# Patient Record
Sex: Female | Born: 1965 | Race: White | Hispanic: No | Marital: Married | Smoking: Never smoker
Health system: Southern US, Community
[De-identification: ages and names within clinical notes are randomized; demographics above are authoritative.]

## PROBLEM LIST (undated history)

## (undated) DIAGNOSIS — C8190 Hodgkin lymphoma, unspecified, unspecified site: Secondary | ICD-10-CM

## (undated) DIAGNOSIS — Q2572 Congenital pulmonary arteriovenous malformation: Secondary | ICD-10-CM

## (undated) HISTORY — DX: Hodgkin lymphoma, unspecified, unspecified site: C81.90

## (undated) HISTORY — DX: Congenital pulmonary arteriovenous malformation: Q25.72

---

## 2001-12-17 HISTORY — PX: PORTACATH PLACEMENT: SHX2246

## 2001-12-17 HISTORY — PX: LYMPH NODE BIOPSY: SHX201

## 2003-03-12 ENCOUNTER — Other Ambulatory Visit: Admission: RE | Admit: 2003-03-12 | Discharge: 2003-03-12 | Payer: Self-pay | Admitting: Obstetrics and Gynecology

## 2003-04-15 ENCOUNTER — Ambulatory Visit (HOSPITAL_COMMUNITY): Admission: RE | Admit: 2003-04-15 | Discharge: 2003-04-15 | Payer: Self-pay | Admitting: General Surgery

## 2003-12-18 DIAGNOSIS — C8190 Hodgkin lymphoma, unspecified, unspecified site: Secondary | ICD-10-CM

## 2003-12-18 HISTORY — DX: Hodgkin lymphoma, unspecified, unspecified site: C81.90

## 2004-04-10 ENCOUNTER — Encounter: Admission: RE | Admit: 2004-04-10 | Discharge: 2004-04-10 | Payer: Self-pay | Admitting: Family Medicine

## 2004-05-16 ENCOUNTER — Other Ambulatory Visit: Admission: RE | Admit: 2004-05-16 | Discharge: 2004-05-16 | Payer: Self-pay | Admitting: Obstetrics and Gynecology

## 2005-01-02 ENCOUNTER — Ambulatory Visit (HOSPITAL_COMMUNITY): Admission: RE | Admit: 2005-01-02 | Discharge: 2005-01-02 | Payer: Self-pay | Admitting: Obstetrics and Gynecology

## 2005-01-02 ENCOUNTER — Encounter (INDEPENDENT_AMBULATORY_CARE_PROVIDER_SITE_OTHER): Payer: Self-pay | Admitting: Specialist

## 2005-02-21 ENCOUNTER — Emergency Department (HOSPITAL_COMMUNITY): Admission: EM | Admit: 2005-02-21 | Discharge: 2005-02-21 | Payer: Self-pay | Admitting: Emergency Medicine

## 2005-05-28 ENCOUNTER — Encounter: Admission: RE | Admit: 2005-05-28 | Discharge: 2005-05-28 | Payer: Self-pay | Admitting: *Deleted

## 2005-06-27 ENCOUNTER — Ambulatory Visit: Payer: Self-pay | Admitting: Oncology

## 2005-09-26 ENCOUNTER — Ambulatory Visit: Payer: Self-pay | Admitting: Oncology

## 2005-12-25 IMAGING — US US INTRAOPERATIVE - NO REPORT
1 series · 4 of 4 positions shown · non-contrast
Comparison: none

[Series 1: us intraoperative - no report · 0.29mm/px · 4 of 4 slices shown]
[im 1/4]
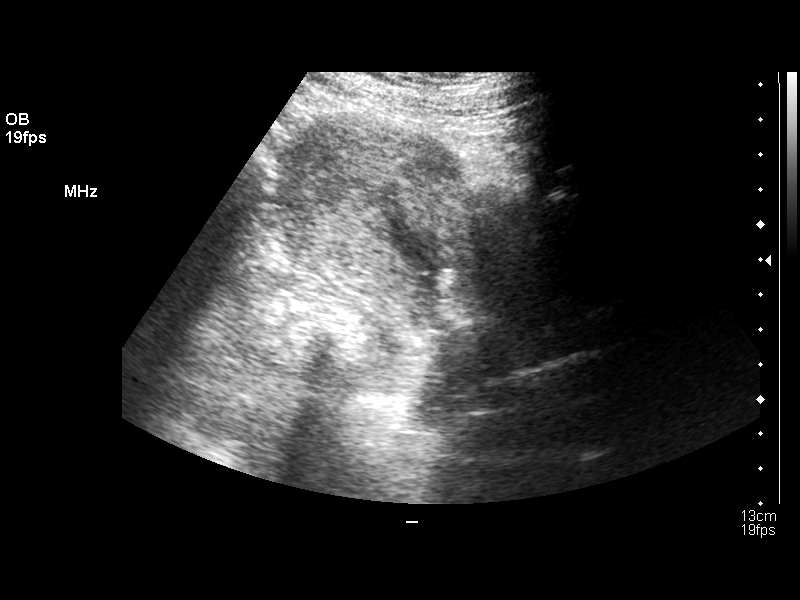
[im 2/4]
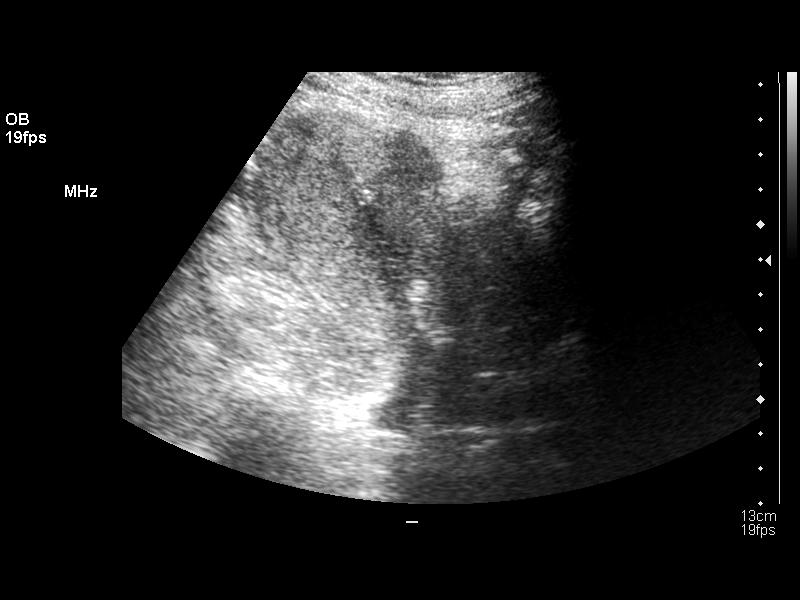
[im 3/4]
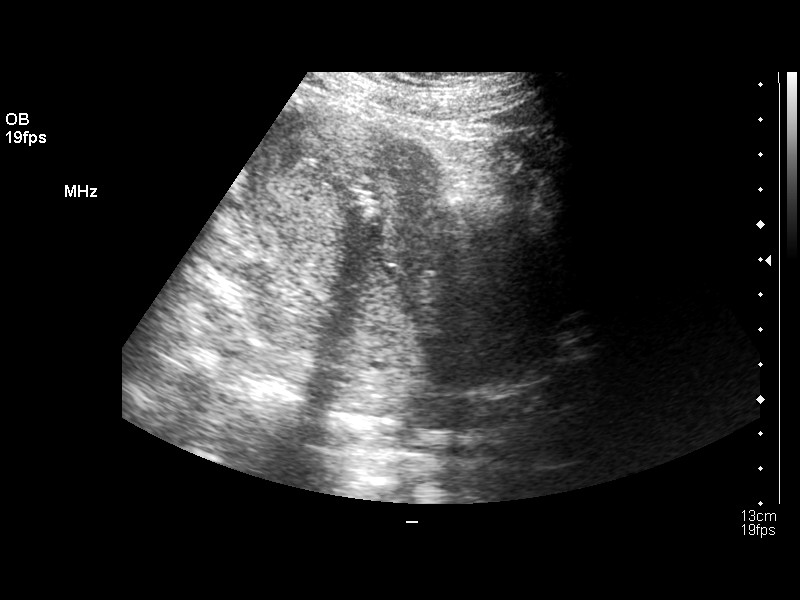
[im 4/4]
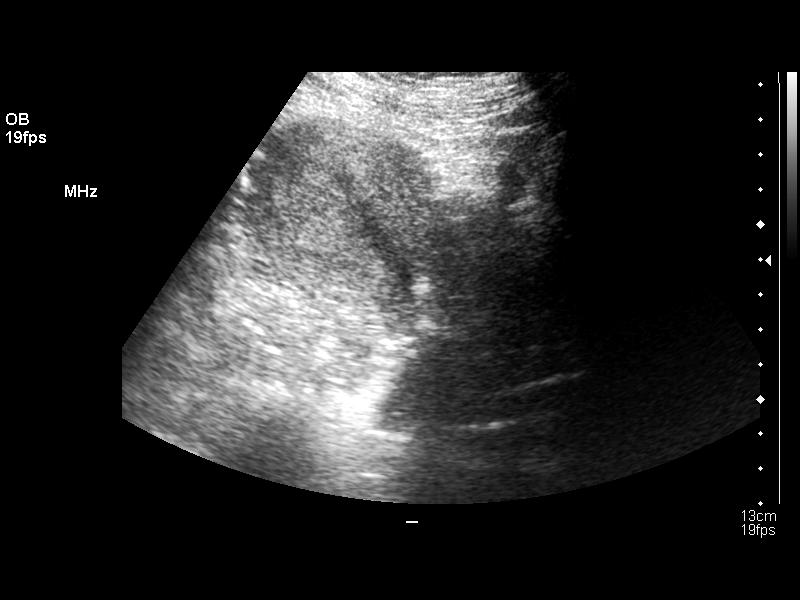

[4 of 4 positions shown; findings below may reference images not displayed]

ULTRASOUND INTRAOPERATIVE:

 Ultrasound was utilized in the operating room by the requesting physician.

## 2006-02-26 ENCOUNTER — Inpatient Hospital Stay (HOSPITAL_COMMUNITY): Admission: AD | Admit: 2006-02-26 | Discharge: 2006-02-28 | Payer: Self-pay | Admitting: Obstetrics and Gynecology

## 2006-03-11 ENCOUNTER — Encounter: Admission: RE | Admit: 2006-03-11 | Discharge: 2006-03-11 | Payer: Self-pay | Admitting: Family Medicine

## 2006-03-20 ENCOUNTER — Ambulatory Visit: Payer: Self-pay | Admitting: Oncology

## 2006-03-21 LAB — COMPREHENSIVE METABOLIC PANEL
ALT: 13 U/L (ref 0–40)
BUN: 10 mg/dL (ref 6–23)
CO2: 27 mEq/L (ref 19–32)
Calcium: 10 mg/dL (ref 8.4–10.5)
Chloride: 105 mEq/L (ref 96–112)
Creatinine, Ser: 1 mg/dL (ref 0.4–1.2)
Glucose, Bld: 74 mg/dL (ref 70–99)

## 2006-03-21 LAB — CBC WITH DIFFERENTIAL/PLATELET
BASO%: 0.3 % (ref 0.0–2.0)
EOS%: 2.6 % (ref 0.0–7.0)
MCH: 29.5 pg (ref 26.0–34.0)
MCV: 87.9 fL (ref 81.0–101.0)
MONO#: 0.5 10*3/uL (ref 0.1–0.9)
MONO%: 6.7 % (ref 0.0–13.0)
Platelets: 365 10*3/uL (ref 145–400)
RBC: 4.8 10*6/uL (ref 3.70–5.32)
WBC: 7 10*3/uL (ref 3.9–10.0)

## 2006-03-21 LAB — TSH: TSH: 1.504 u[IU]/mL (ref 0.350–5.500)

## 2006-03-25 ENCOUNTER — Ambulatory Visit (HOSPITAL_COMMUNITY): Admission: RE | Admit: 2006-03-25 | Discharge: 2006-03-25 | Payer: Self-pay | Admitting: Oncology

## 2006-03-27 ENCOUNTER — Encounter: Admission: RE | Admit: 2006-03-27 | Discharge: 2006-03-27 | Payer: Self-pay | Admitting: Obstetrics and Gynecology

## 2006-09-17 ENCOUNTER — Ambulatory Visit: Payer: Self-pay | Admitting: Oncology

## 2006-11-04 ENCOUNTER — Ambulatory Visit (HOSPITAL_COMMUNITY): Admission: RE | Admit: 2006-11-04 | Discharge: 2006-11-04 | Payer: Self-pay | Admitting: Oncology

## 2007-03-20 ENCOUNTER — Ambulatory Visit: Payer: Self-pay | Admitting: Oncology

## 2007-03-25 LAB — CBC WITH DIFFERENTIAL/PLATELET
Basophils Absolute: 0 10*3/uL (ref 0.0–0.1)
EOS%: 3 % (ref 0.0–7.0)
HGB: 15.4 g/dL (ref 11.6–15.9)
MCH: 31.2 pg (ref 26.0–34.0)
NEUT#: 3.8 10*3/uL (ref 1.5–6.5)
RDW: 12.5 % (ref 11.3–14.5)
WBC: 6.4 10*3/uL (ref 3.9–10.0)
lymph#: 1.9 10*3/uL (ref 0.9–3.3)

## 2007-03-25 LAB — COMPREHENSIVE METABOLIC PANEL
AST: 17 U/L (ref 0–37)
Albumin: 4.5 g/dL (ref 3.5–5.2)
Alkaline Phosphatase: 63 U/L (ref 39–117)
Glucose, Bld: 86 mg/dL (ref 70–99)
Potassium: 3.9 mEq/L (ref 3.5–5.3)
Sodium: 139 mEq/L (ref 135–145)
Total Protein: 7.7 g/dL (ref 6.0–8.3)

## 2007-06-18 ENCOUNTER — Ambulatory Visit: Payer: Self-pay | Admitting: Oncology

## 2007-12-24 ENCOUNTER — Ambulatory Visit: Payer: Self-pay | Admitting: Pulmonary Disease

## 2007-12-31 DIAGNOSIS — Q279 Congenital malformation of peripheral vascular system, unspecified: Secondary | ICD-10-CM | POA: Insufficient documentation

## 2008-01-08 ENCOUNTER — Ambulatory Visit: Payer: Self-pay | Admitting: Oncology

## 2008-01-12 ENCOUNTER — Encounter: Admission: RE | Admit: 2008-01-12 | Discharge: 2008-01-12 | Payer: Self-pay | Admitting: Obstetrics and Gynecology

## 2008-01-12 LAB — CBC WITH DIFFERENTIAL/PLATELET
EOS%: 3.2 % (ref 0.0–7.0)
MCH: 31 pg (ref 26.0–34.0)
MCV: 89.2 fL (ref 81.0–101.0)
MONO%: 7.5 % (ref 0.0–13.0)
RBC: 4.57 10*6/uL (ref 3.70–5.32)
RDW: 12.2 % (ref 11.3–14.5)

## 2008-01-12 LAB — ERYTHROCYTE SEDIMENTATION RATE: Sed Rate: 5 mm/hr (ref 0–30)

## 2008-07-11 ENCOUNTER — Ambulatory Visit: Payer: Self-pay | Admitting: Oncology

## 2008-07-19 LAB — CBC WITH DIFFERENTIAL/PLATELET
BASO%: 0.4 % (ref 0.0–2.0)
EOS%: 3.1 % (ref 0.0–7.0)
HCT: 43.2 % (ref 34.8–46.6)
HGB: 14.9 g/dL (ref 11.6–15.9)
MCH: 31.4 pg (ref 26.0–34.0)
NEUT%: 59.9 % (ref 39.6–76.8)
Platelets: 319 10*3/uL (ref 145–400)
RBC: 4.76 10*6/uL (ref 3.70–5.32)

## 2008-07-19 LAB — ERYTHROCYTE SEDIMENTATION RATE: Sed Rate: 2 mm/hr (ref 0–30)

## 2009-02-07 ENCOUNTER — Encounter: Admission: RE | Admit: 2009-02-07 | Discharge: 2009-02-07 | Payer: Self-pay | Admitting: Obstetrics and Gynecology

## 2009-02-10 ENCOUNTER — Encounter: Admission: RE | Admit: 2009-02-10 | Discharge: 2009-02-10 | Payer: Self-pay | Admitting: Obstetrics and Gynecology

## 2009-03-23 ENCOUNTER — Ambulatory Visit: Payer: Self-pay | Admitting: Oncology

## 2009-03-23 ENCOUNTER — Ambulatory Visit (HOSPITAL_COMMUNITY): Admission: RE | Admit: 2009-03-23 | Discharge: 2009-03-23 | Payer: Self-pay | Admitting: Oncology

## 2009-03-23 ENCOUNTER — Encounter: Payer: Self-pay | Admitting: Pulmonary Disease

## 2010-03-20 ENCOUNTER — Encounter: Admission: RE | Admit: 2010-03-20 | Discharge: 2010-03-20 | Payer: Self-pay | Admitting: Obstetrics and Gynecology

## 2010-03-22 ENCOUNTER — Ambulatory Visit: Payer: Self-pay | Admitting: Oncology

## 2011-01-06 ENCOUNTER — Encounter: Payer: Self-pay | Admitting: Oncology

## 2011-01-06 ENCOUNTER — Other Ambulatory Visit: Payer: Self-pay | Admitting: Obstetrics and Gynecology

## 2011-01-06 DIAGNOSIS — Z0000 Encounter for general adult medical examination without abnormal findings: Secondary | ICD-10-CM

## 2011-01-06 DIAGNOSIS — Z1231 Encounter for screening mammogram for malignant neoplasm of breast: Secondary | ICD-10-CM

## 2011-01-07 ENCOUNTER — Encounter: Payer: Self-pay | Admitting: Obstetrics and Gynecology

## 2011-03-20 ENCOUNTER — Ambulatory Visit (HOSPITAL_COMMUNITY)
Admission: RE | Admit: 2011-03-20 | Discharge: 2011-03-20 | Disposition: A | Discharge status: Home / Self Care | Payer: BC Managed Care – PPO | Source: Ambulatory Visit | Attending: Oncology | Admitting: Oncology

## 2011-03-20 ENCOUNTER — Other Ambulatory Visit: Payer: Self-pay | Admitting: Oncology

## 2011-03-20 ENCOUNTER — Encounter (HOSPITAL_BASED_OUTPATIENT_CLINIC_OR_DEPARTMENT_OTHER): Payer: BC Managed Care – PPO | Admitting: Oncology

## 2011-03-20 DIAGNOSIS — J984 Other disorders of lung: Secondary | ICD-10-CM

## 2011-03-20 DIAGNOSIS — C8112 Nodular sclerosis classical Hodgkin lymphoma, intrathoracic lymph nodes: Secondary | ICD-10-CM

## 2011-03-20 DIAGNOSIS — C8190 Hodgkin lymphoma, unspecified, unspecified site: Secondary | ICD-10-CM | POA: Insufficient documentation

## 2011-04-11 ENCOUNTER — Ambulatory Visit: Payer: Self-pay

## 2011-05-03 ENCOUNTER — Ambulatory Visit: Payer: Self-pay

## 2011-05-04 NOTE — Op Note (Signed)
NAME:  Shannon Mann, Shannon Mann                  ACCOUNT NO.:  [id number]   MEDICAL RECORD NO.:  [id number]          PATIENT TYPE:  INP   LOCATION:  9199                          FACILITY:  [facility]   PHYSICIAN:  Lenoard Aden, M.D.DATE OF BIRTH:  10/10/1966   DATE OF PROCEDURE:  02/26/2006  DATE OF DISCHARGE:                                 OPERATIVE REPORT   INDICATIONS FOR OPERATIVE VAGINAL DELIVERY:  Maternal exhaustion, prolonged  second stage.   OPERATION/PROCEDURE:  Outlet vacuum-assisted vaginal delivery.   DESCRIPTION OF PROCEDURE:  After being apprised of the risks of small risk  of cephalhematoma, scalp laceration, intracranial hemorrhage, fetal vertex  presenting straight occiput posterior position, +3 +4 station, Kiwi cup  explained and placed after emptying the bladder of approximately 50 mL of  clear urine.  Kiwi cup for three pulls.  No popoffs.  Full term living  female, second degree midline episiotomy.  Bulb suctioning performed.  Apgars 8 and 9.  Cord blood collected.  Placenta delivered manually intact.  Three-vessel cord noted.  No meconium noted.  No lacerations noted, vaginal  or cervical.  Cervix is without any laceration.  Estimated blood loss 500  mL.  Uterus with good tone status post procedure.  The patient tolerates the  procedure well and is recovering in good condition.      Lenoard Aden, M.D.  Electronically Signed     RJT/MEDQ  D:  02/26/2006  T:  02/27/2006  Job:  [id number]

## 2011-05-04 NOTE — H&P (Signed)
NAME:  Shannon Mann, Shannon Mann                  ACCOUNT NO.:  [id number]   MEDICAL RECORD NO.:  [id number]          PATIENT TYPE:  INP   LOCATION:  9162                          FACILITY:  [facility]   PHYSICIAN:  Lenoard Aden, M.D.DATE OF BIRTH:  1966/12/08   DATE OF ADMISSION:  02/26/2006  DATE OF DISCHARGE:                                HISTORY & PHYSICAL   CHIEF COMPLAINT:  Labor.   HISTORY OF PRESENT ILLNESS:  The patient is a 45 year old white female, G3,  P1, EGD of February 26, 2006 at [redacted] weeks gestation with increased frequency of  contractions today.   PAST MEDICAL HISTORY:  1.  Remarkable for forceps-assisted vaginal delivery in 2002, and D&E for a      trisomy 18 fetus in 2006.  2.  Asthma, currently asymptomatic.  3.  History of Hodgkin's disease, status post chemotherapy and radiation,      currently in remission.   SOCIAL HISTORY:  She is a nonsmoker, nondrinker.  Denies domestic or  physical violence.   FAMILY HISTORY:  Heart disease and hypercoagulability.   PRENATAL LABORATORY DATA:  Blood type A positive, rubella immune, hepatitis  and HIV nonreactive, GBS negative.   PHYSICAL EXAMINATION:  GENERAL:  She is a well-developed, well-nourished,  white female in no acute distress.  HEENT:  Normal.  LUNGS:  Clear.  HEART:  Regular rhythm.  ABDOMEN:  Soft, gravid, nontender.  Estimated fetal weight 7 to 7.5 pounds.  PELVIC:  Cervix is 6 cm, 90% vertex, 0 station.  EXTREMITIES:  No cords.  NEUROLOGIC:  Nonfocal.   IMPRESSION:  1.  Term intrauterine pregnancy in active labor.  2.  History of operative vaginal delivery with planned anticipated attempts      at vagina delivery.      Lenoard Aden, M.D.  Electronically Signed     RJT/MEDQ  D:  02/26/2006  T:  02/26/2006  Job:  [id number]

## 2011-08-16 ENCOUNTER — Other Ambulatory Visit: Payer: Self-pay | Admitting: Family Medicine

## 2011-08-16 ENCOUNTER — Ambulatory Visit
Admission: RE | Admit: 2011-08-16 | Discharge: 2011-08-16 | Disposition: A | Discharge status: Home / Self Care | Payer: BC Managed Care – PPO | Source: Ambulatory Visit | Attending: Family Medicine | Admitting: Family Medicine

## 2011-08-16 DIAGNOSIS — R059 Cough, unspecified: Secondary | ICD-10-CM

## 2011-08-16 DIAGNOSIS — R05 Cough: Secondary | ICD-10-CM

## 2012-02-05 ENCOUNTER — Telehealth: Payer: Self-pay | Admitting: *Deleted

## 2012-02-05 NOTE — Telephone Encounter (Signed)
Left VM reporting "I've been having symptoms for about 6 months now". Asking to be seen by Dr. Truett Perna. Left no details.

## 2012-02-05 NOTE — Telephone Encounter (Signed)
Left VM for patient to call back tomorrow with more details on her "symptoms" and details of any imaging or testing she has had done. Reminded her to please leave her DOB when she returns call.

## 2012-02-08 ENCOUNTER — Telehealth: Payer: Self-pay | Admitting: *Deleted

## 2012-02-08 NOTE — Telephone Encounter (Signed)
Message from pt reporting she has had a persistent cough "off and on for six months". Was seen by Dr. Gaylan Gerold in August with CXRay. Was told it was clear and given Albuterol inhaler for wheezing and cough. Concerned that cough keeps returning. Pt reports it feels as if there is a "lump" in her throat similar to when she was initially diagnosed. Asking to be seen by Dr. Truett Perna to "clear her mind". Reviewed with Dr. Truett Perna: Pt should see Dr. Shelle Iron again to work up this cough. Does not sound like this is related to lymphoma. Dr. Truett Perna is glad to discuss with Dr. Shelle Iron if necessary- will work pt in if Dr. Shelle Iron thinks it is related to lymphoma. Left message on voicemail for pt.

## 2012-02-19 ENCOUNTER — Ambulatory Visit
Admission: RE | Admit: 2012-02-19 | Discharge: 2012-02-19 | Disposition: A | Discharge status: Home / Self Care | Payer: BC Managed Care – PPO | Source: Ambulatory Visit | Attending: Obstetrics and Gynecology | Admitting: Obstetrics and Gynecology

## 2012-02-19 DIAGNOSIS — Z1231 Encounter for screening mammogram for malignant neoplasm of breast: Secondary | ICD-10-CM

## 2012-03-13 ENCOUNTER — Telehealth: Payer: Self-pay | Admitting: Oncology

## 2012-03-13 NOTE — Telephone Encounter (Signed)
pt rtn  call and confirm appt for 04/12

## 2012-03-13 NOTE — Telephone Encounter (Signed)
called pt lmovm for appt on 04/12. asked pt to rtn call to confrim appt

## 2012-03-25 ENCOUNTER — Encounter: Payer: Self-pay | Admitting: Pulmonary Disease

## 2012-03-26 ENCOUNTER — Encounter: Payer: Self-pay | Admitting: Pulmonary Disease

## 2012-03-26 ENCOUNTER — Ambulatory Visit (INDEPENDENT_AMBULATORY_CARE_PROVIDER_SITE_OTHER): Payer: BC Managed Care – PPO | Admitting: Pulmonary Disease

## 2012-03-26 VITALS — BP 102/78 | HR 102 | Temp 98.3°F | Ht 68.0 in | Wt 151.8 lb

## 2012-03-26 DIAGNOSIS — R05 Cough: Secondary | ICD-10-CM

## 2012-03-26 DIAGNOSIS — R053 Chronic cough: Secondary | ICD-10-CM

## 2012-03-26 DIAGNOSIS — R059 Cough, unspecified: Secondary | ICD-10-CM

## 2012-03-26 NOTE — Assessment & Plan Note (Signed)
The patient has a chronic cough that has been off and on since August of last year.  From her description it sounds more upper airway and lower, and she is having ongoing sinus pressure with discolored mucus at times and a nasally voice.  I wonder if she may have chronic sinusitis that has not totally cleared.  She was started on symbicort Insall some improvement, however she was also treated at the same time with antihistamines and other therapy.  We'll need to consider whether the patient needs to be on symbicort, and whether she may have an issue with some type of reactive airways disease.  Because her history is so suggestive of an upper airway cough, I would like to image her sinuses to see if she has chronic sinusitis.  If these are clear, we can consider more aggressive treatment for allergic rhinitis.  I've also discussed with her the behavioral therapies which can help with cyclical coughing.  If she does not improve, we'll need to do pulmonary function studies to evaluate for airways disease.  I have asked her to stop symbicort and albuterol in the interim, since these can also aggravate an upper airway cough.

## 2012-03-26 NOTE — Progress Notes (Signed)
Subjective:    Patient ID: Unknown Shannon Mann, female    DOB: February 10, 1966, 47 y.o.   MRN: [id number]  HPI The patient is a 46 year old female who I've been asked to see for chronic cough.  I had seen her in the past for a right lower lobe pulmonary AVM, and she has been asymptomatic from this standpoint.  She does have a history of Hodgkin's lymphoma, and was treated with radiation and bleomycin.  The chemotherapy had to be discontinued because of concern for pulmonary toxicity.  The patient also has a history of frequent sinusitis.  She was in her usual state of health until August of this year when she began to develop a severe cough.  There was no bilateral prodrome or other initiating event, but she later developed a sinus infection.  She was treated with antibiotics and nasal hygiene, and her cough improved.  It has been intermittent since that time approximately one month ago it worsened again.  Patient was treated with albuterol without improvement, but then symbicort, antihistamines, and other treatments were added with improvement noted.  She is unsure exactly what made her better.  The patient continues to have sinus pressure as well as a nasal voice.  She has intermittent mucus production from her nose that can be discolored at times.  She does have postnasal drip with fairly frequent throat clearing.  She notes a tickle in her throat as well as voice changes.  Her cough can increase with prolonged conversation.  She denies any issues with ongoing reflux.  She really has not had significant exertional dyspnea that would interfere with her activities of daily living, but she does not feel that she is breathing as well when she pushes herself.  Her last chest x-ray was in the fall of last year and was unremarkable for acute changes.  Her last PFTs were many years ago at the time of her chemotherapy.   Review of Systems  Constitutional: Negative for fever and unexpected weight change.  HENT: Positive for  congestion. Negative for ear pain, nosebleeds, sore throat, rhinorrhea, sneezing, trouble swallowing, dental problem, postnasal drip and sinus pressure.   Eyes: Negative for redness and itching.  Respiratory: Positive for cough and shortness of breath. Negative for chest tightness and wheezing.   Cardiovascular: Negative for palpitations and leg swelling.  Gastrointestinal: Negative for nausea and vomiting.  Genitourinary: Negative for dysuria.  Musculoskeletal: Negative for joint swelling.  Skin: Negative for rash.  Neurological: Negative for headaches.  Hematological: Does not bruise/bleed easily.  Psychiatric/Behavioral: Negative for dysphoric mood. The patient is not nervous/anxious.        Objective:   Physical Exam Constitutional:  Well developed, no acute distress  HENT:  Nares patent without discharge, +septal deviation to left with narrowing.  Oropharynx without exudate, palate and uvula are normal  Eyes:  Perrla, eomi, no scleral icterus  Neck:  No JVD, no TMG  Cardiovascular:  Normal rate, regular rhythm, no rubs or gallops.  No murmurs        Intact distal pulses  Pulmonary :  Normal breath sounds, no stridor or respiratory distress   No rales, rhonchi, or wheezing  Abdominal:  Soft, nondistended, bowel sounds present.  No tenderness noted.   Musculoskeletal:  No lower extremity edema noted.  Lymph Nodes:  No cervical lymphadenopathy noted  Skin:  No cyanosis noted  Neurologic:  Alert, appropriate, moves all 4 extremities without obvious deficit.         Assessment &  Plan:

## 2012-03-26 NOTE — Patient Instructions (Signed)
Stop symbicort and albuterol.  Please call me if cough worsens Will schedule for scan of your sinuses to make sure you do not have chronic sinusitis driving all of this.  Will call you with results Try to avoid throat clearing, do not overuse your voice. Will make further decisions about treatment after your sinus scan.

## 2012-03-28 ENCOUNTER — Ambulatory Visit: Payer: BC Managed Care – PPO | Admitting: Oncology

## 2012-03-31 ENCOUNTER — Ambulatory Visit (INDEPENDENT_AMBULATORY_CARE_PROVIDER_SITE_OTHER)
Admission: RE | Admit: 2012-03-31 | Discharge: 2012-03-31 | Disposition: A | Discharge status: Home / Self Care | Payer: BC Managed Care – PPO | Source: Ambulatory Visit | Attending: Pulmonary Disease | Admitting: Pulmonary Disease

## 2012-03-31 ENCOUNTER — Other Ambulatory Visit: Payer: Self-pay | Admitting: Pulmonary Disease

## 2012-03-31 DIAGNOSIS — R05 Cough: Secondary | ICD-10-CM

## 2012-03-31 DIAGNOSIS — R053 Chronic cough: Secondary | ICD-10-CM

## 2012-03-31 DIAGNOSIS — R059 Cough, unspecified: Secondary | ICD-10-CM

## 2012-03-31 NOTE — Progress Notes (Signed)
Quick Note:  Spoke with pt and notified of results per Dr Shelle Iron.. Pt verbalized understanding and denied any questions. She repeated the instructions back to me.  Order was sent to [facility] to schedule PFT at [facility] ______

## 2012-04-07 ENCOUNTER — Other Ambulatory Visit (HOSPITAL_COMMUNITY): Payer: Self-pay | Admitting: Respiratory Therapy

## 2012-04-07 ENCOUNTER — Ambulatory Visit (HOSPITAL_COMMUNITY)
Admission: RE | Admit: 2012-04-07 | Discharge: 2012-04-07 | Disposition: A | Discharge status: Home / Self Care | Payer: BC Managed Care – PPO | Source: Ambulatory Visit | Attending: Pulmonary Disease | Admitting: Pulmonary Disease

## 2012-04-07 DIAGNOSIS — R05 Cough: Secondary | ICD-10-CM | POA: Insufficient documentation

## 2012-04-07 DIAGNOSIS — R059 Cough, unspecified: Secondary | ICD-10-CM | POA: Insufficient documentation

## 2012-04-07 DIAGNOSIS — R0609 Other forms of dyspnea: Secondary | ICD-10-CM | POA: Insufficient documentation

## 2012-04-07 DIAGNOSIS — J988 Other specified respiratory disorders: Secondary | ICD-10-CM | POA: Insufficient documentation

## 2012-04-07 DIAGNOSIS — R0989 Other specified symptoms and signs involving the circulatory and respiratory systems: Secondary | ICD-10-CM | POA: Insufficient documentation

## 2012-04-07 MED ORDER — ALBUTEROL SULFATE (5 MG/ML) 0.5% IN NEBU
2.5000 mg | INHALATION_SOLUTION | Freq: Once | RESPIRATORY_TRACT | Status: AC
  Administered 2012-04-07: 2.5 mg via RESPIRATORY_TRACT

## 2012-08-07 IMAGING — CR DG CHEST 2V
2 series · 2 of 2 positions shown · non-contrast
Comparison: Chest x-ray 03/20/2011.

CLINICAL DATA: Cough.  History of Hodgkin's lymphoma.

CHEST - 2 VIEW

[view not recorded (1 of 2)]
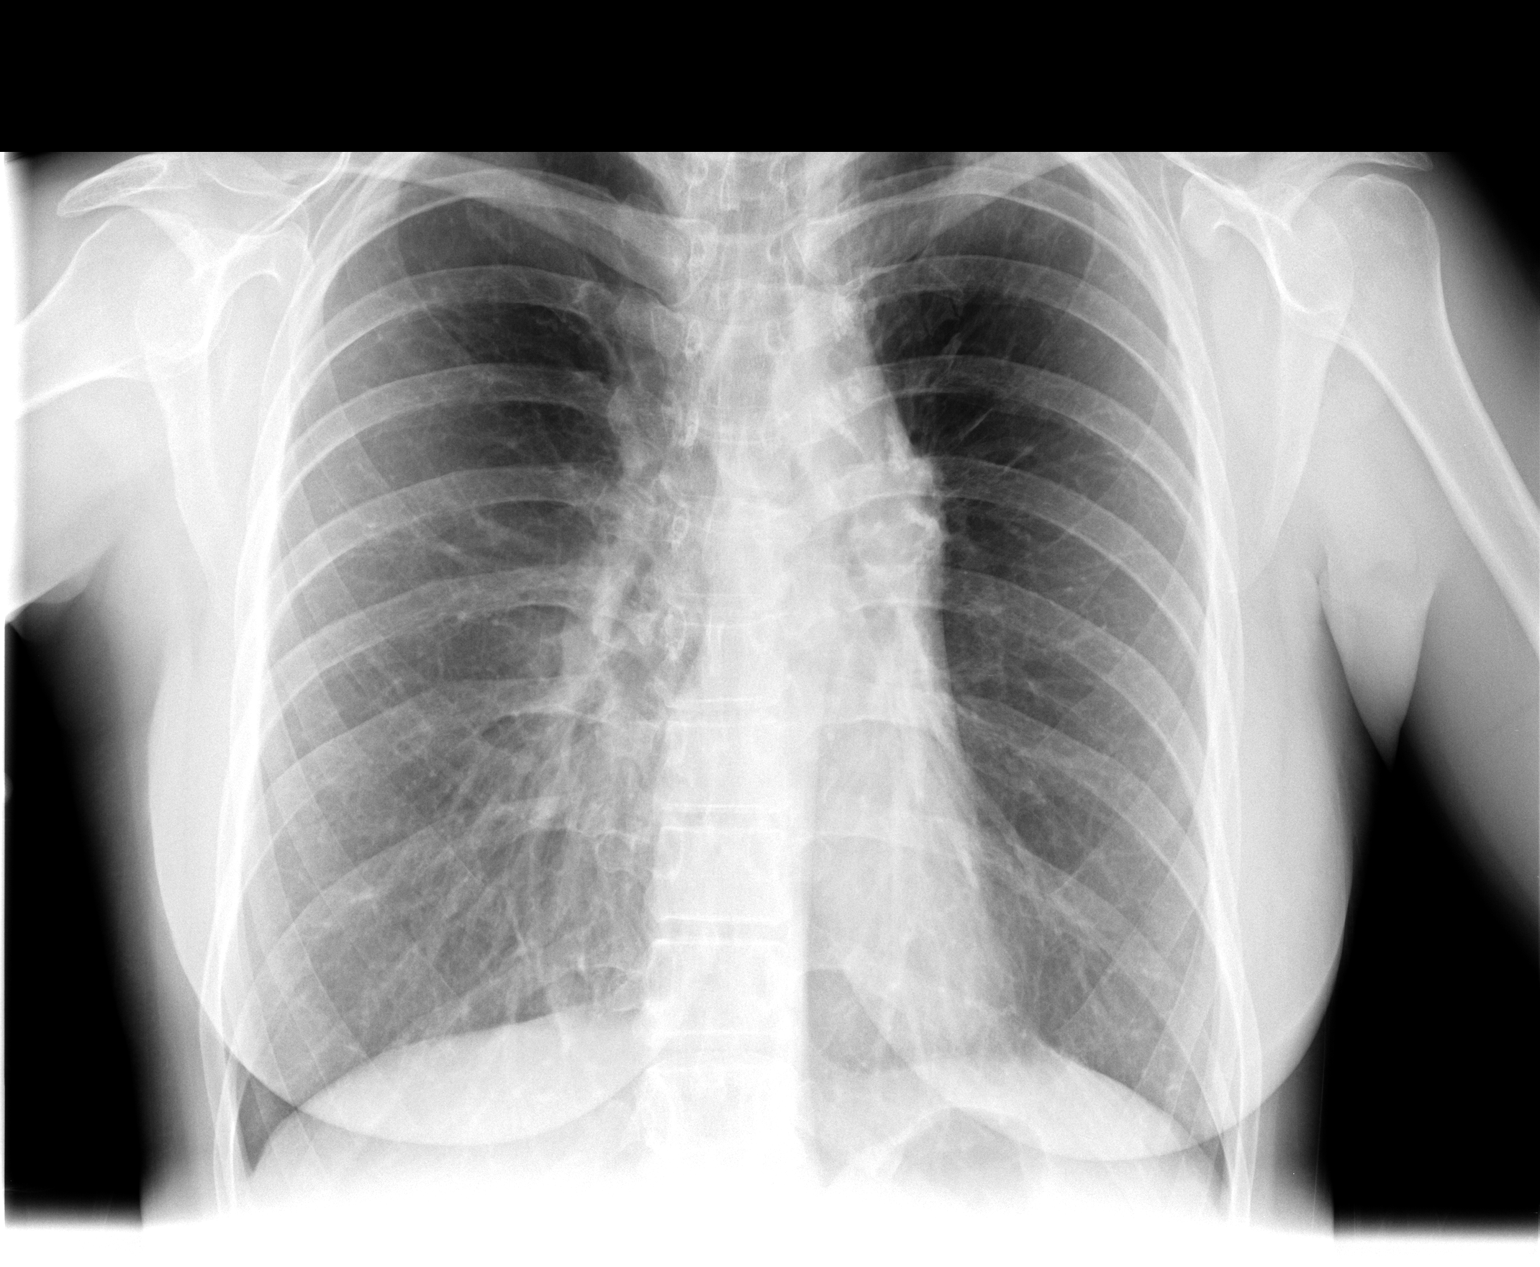

[view not recorded (2 of 2)]
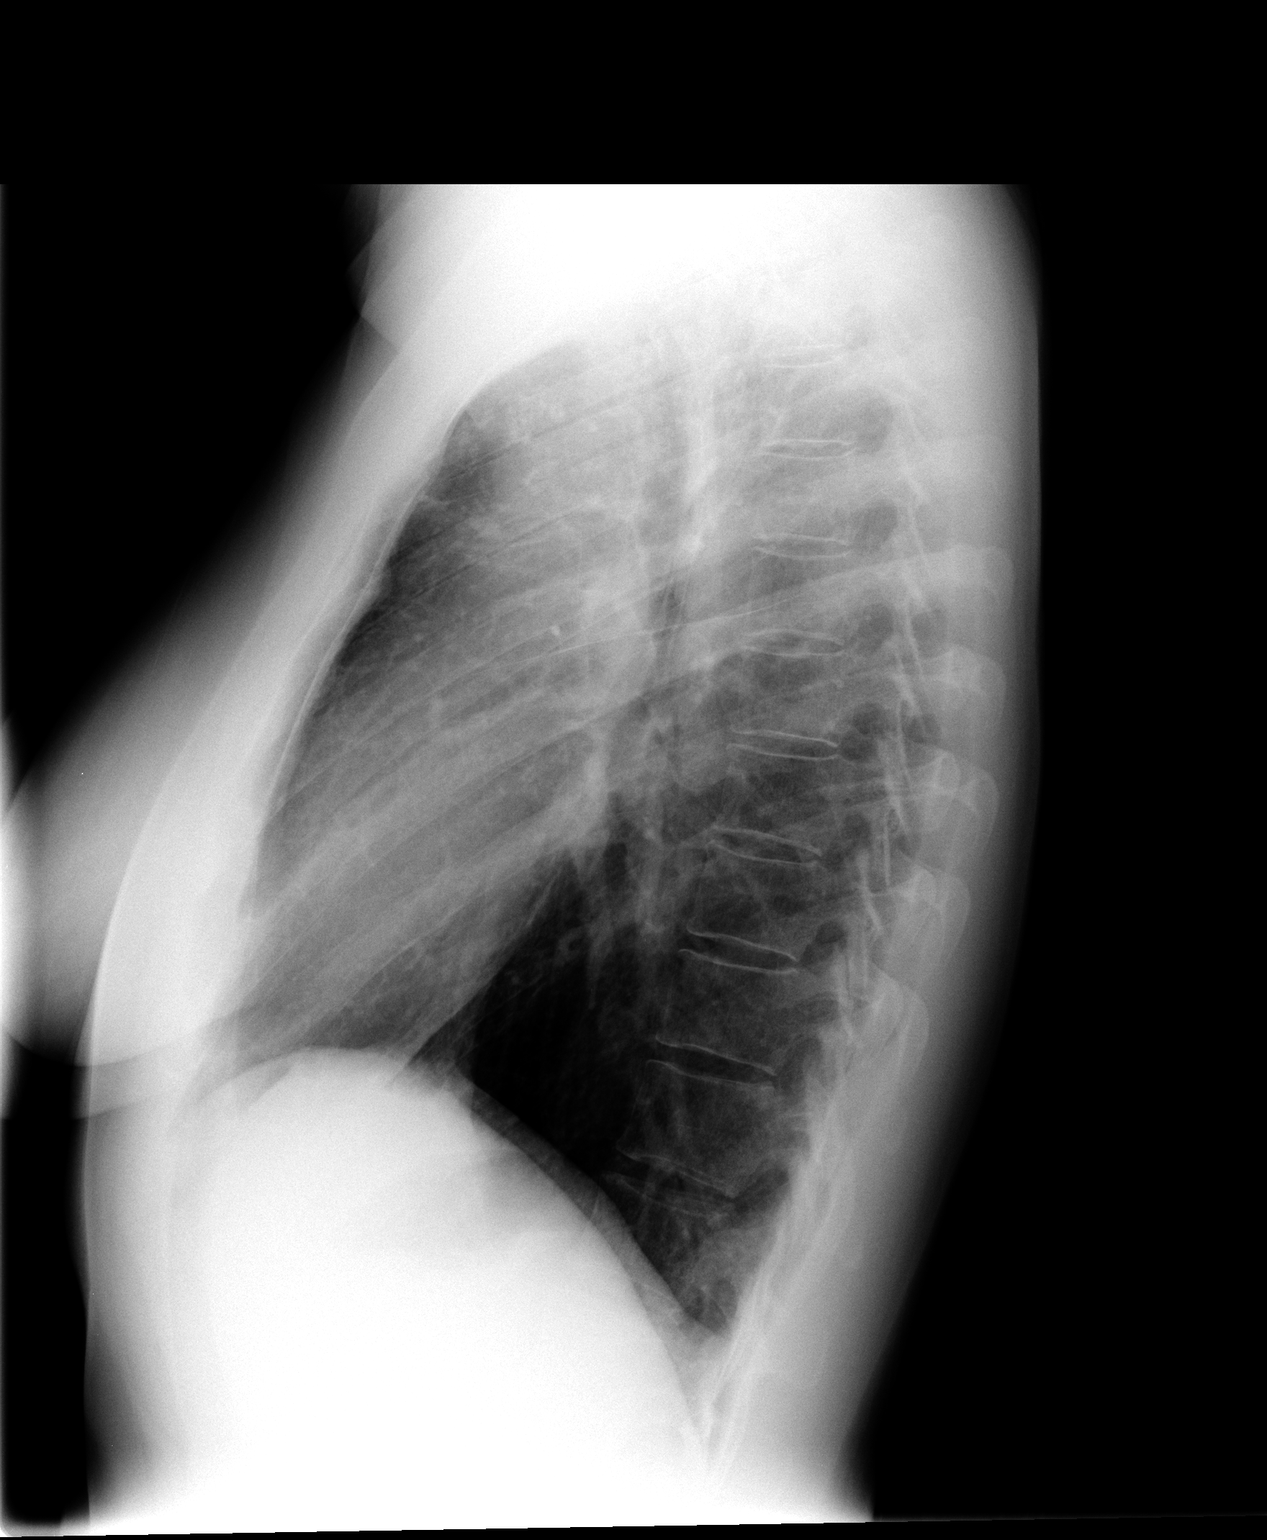

[2 of 2 positions shown; findings below may reference images not displayed]

FINDINGS: The cardiac silhouette, mediastinal and hilar contours
are stable.  Stable radiation changes involving the mediastinal and
paramediastinal lung.  Biapical pleural and parenchymal scarring
changes are stable.  No acute pulmonary findings.  No infiltrates
or effusions.  The bony thorax is intact.
IMPRESSION: Stable chronic lung changes without definite acute overlying
pulmonary process.

## 2013-01-13 ENCOUNTER — Other Ambulatory Visit: Payer: Self-pay | Admitting: Obstetrics and Gynecology

## 2013-01-13 DIAGNOSIS — Z1231 Encounter for screening mammogram for malignant neoplasm of breast: Secondary | ICD-10-CM

## 2013-04-13 ENCOUNTER — Ambulatory Visit
Admission: RE | Admit: 2013-04-13 | Discharge: 2013-04-13 | Disposition: A | Discharge status: Home / Self Care | Payer: BC Managed Care – PPO | Source: Ambulatory Visit | Attending: Obstetrics and Gynecology | Admitting: Obstetrics and Gynecology

## 2013-04-13 DIAGNOSIS — Z1231 Encounter for screening mammogram for malignant neoplasm of breast: Secondary | ICD-10-CM

## 2014-05-24 ENCOUNTER — Other Ambulatory Visit: Payer: Self-pay

## 2014-05-24 DIAGNOSIS — Z1231 Encounter for screening mammogram for malignant neoplasm of breast: Secondary | ICD-10-CM

## 2014-06-07 ENCOUNTER — Encounter (INDEPENDENT_AMBULATORY_CARE_PROVIDER_SITE_OTHER): Payer: Self-pay

## 2014-06-07 ENCOUNTER — Ambulatory Visit
Admission: RE | Admit: 2014-06-07 | Discharge: 2014-06-07 | Disposition: A | Discharge status: Home / Self Care | Payer: BC Managed Care – PPO | Source: Ambulatory Visit

## 2014-06-07 DIAGNOSIS — Z1231 Encounter for screening mammogram for malignant neoplasm of breast: Secondary | ICD-10-CM

## 2015-09-29 ENCOUNTER — Ambulatory Visit (INDEPENDENT_AMBULATORY_CARE_PROVIDER_SITE_OTHER): Payer: BLUE CROSS/BLUE SHIELD | Admitting: Sports Medicine

## 2015-09-29 ENCOUNTER — Encounter: Payer: Self-pay | Admitting: Sports Medicine

## 2015-09-29 VITALS — BP 117/58 | HR 75 | Ht 67.5 in | Wt 140.0 lb

## 2015-09-29 DIAGNOSIS — M25562 Pain in left knee: Secondary | ICD-10-CM | POA: Diagnosis not present

## 2015-09-29 DIAGNOSIS — M216X9 Other acquired deformities of unspecified foot: Secondary | ICD-10-CM

## 2015-09-29 DIAGNOSIS — M21612 Bunion of left foot: Secondary | ICD-10-CM | POA: Diagnosis not present

## 2015-09-29 NOTE — Progress Notes (Signed)
Subjective:    Patient ID: Shannon Mann, female    DOB: 1966/09/16, 49 y.o.   MRN: [id number]  HPI  49 y/o female with history of a skiing accident with left knee injury, who presents with left knee pain.  She has previously rolled down a hill about 120 yds while skiing about 20 years ago.  She had an injury to her left knee, but cannot remember the injury.  She does know that it was not an ACL tear, but a meniscal injury does sound familiar.  She did have a hip joint dislocation at that time too.  She spent 2 years in PT and has not had much issue since that time.  However, over the past 2-3 months, she has developed left medial knee pain when she is running or walking.  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  She uses ice and advil for pain with some relief, but not complete relief.  She describes it as a dull, achy pain.    She does complain of a bunion on her left foot that has been present for the past few years.  It started bothering her recently, especially when she exercises.  She is unsure if it is related to her knee.    Soc; Husband in anesthesia/ likes skiing and recreational sport  Review of Systems MSK: +knee pain, +joint swelling Neuro: denies numbness, paresthesias    Objective:   Physical Exam  Constitutional: She is oriented to person, place, and time. She appears well-developed and well-nourished.  HENT:  Head: Normocephalic and atraumatic.  Neurological: She is alert and oriented to person, place, and time.  Skin: Skin is warm and dry.  Psychiatric: She has a normal mood and affect. Her behavior is normal. Judgment and thought content normal.   MSK: knee- no deformities or atrophy seen.  Normal alignment with weightbearing and non weightbearing.  No erythema.  Mild edema present on left medial joint line. Walks with supinated gait.  Left leg longer while laying down, corrects with sitting up.   +TTP above medial joint line.  Otherwise no  TTP  -Full flexion and extension of bilateral knees.  SI compression intact, has posterior innominate.  -muscle strength intact in all planes, glute medius 5/5 muscle strength  -All ligaments intact without laxity, McMurray's neg, Thessaly's neg, bounce home test neg, apley's grind test neg, mild medial subluxation of patella bilaterally  -Sensation intact  Foot/ankle- Bunion present on left foot and valgus left 1st MTP present.  She has pes cavus and retains high arches upon standing.  Walks with supinated gait.  +TTP along bunion.  No TTP at navicular bone or along plantar fascia.  No ankle instability, full ROM of bilateral ankles.  Muscle strength intact of dorsiflexion and plantarflexion of bilateral ankles.  Neurovasc- sensation and distal pulses intact.  Ultrasound of left knee shows 0.5 cm cartilage defect of supralateral patella.  Otherwise no damage to quad or patella tendon.  No meniscal damage medially or laterally.  No defects deep to patella.  No effusion seen.     Assessment & Plan:  Left knee pain and pes cavus- cartilage defect vs OCD seen on ultrasound of superolateral aspect of left patella.  Likely having subluxation of patella laterally, causing strain or MPFL vs medial retinaculum on the medial side.  No need for strengthening exercises, as patient has no muscle imbalances.  Gave compression sleeve for left knee to wear and gave  orthotics with scaphoid pads for arch support.  Follow up 1-2 weeks for gait analysis while running, as may be biomechanical issue as well.  Please bring running gear.

## 2015-09-30 DIAGNOSIS — M21612 Bunion of left foot: Secondary | ICD-10-CM | POA: Insufficient documentation

## 2015-09-30 DIAGNOSIS — M25569 Pain in unspecified knee: Secondary | ICD-10-CM | POA: Insufficient documentation

## 2015-09-30 DIAGNOSIS — Q667 Congenital pes cavus, unspecified foot: Secondary | ICD-10-CM | POA: Insufficient documentation

## 2015-09-30 NOTE — Assessment & Plan Note (Signed)
Suspect this is PFS  Small OCD lesion left sup patella  Not weak  Likely biomechanical and needs correction of gait  Reck 4 weeks or less

## 2015-09-30 NOTE — Assessment & Plan Note (Signed)
We will likely need some first ray protection

## 2015-09-30 NOTE — Assessment & Plan Note (Signed)
We will need to assess gait  Very likely needs custom orthotics

## 2015-10-06 ENCOUNTER — Ambulatory Visit (INDEPENDENT_AMBULATORY_CARE_PROVIDER_SITE_OTHER): Payer: BLUE CROSS/BLUE SHIELD | Admitting: Sports Medicine

## 2015-10-06 ENCOUNTER — Encounter: Payer: Self-pay | Admitting: Sports Medicine

## 2015-10-06 VITALS — BP 105/58 | HR 87 | Ht 68.0 in | Wt 140.0 lb

## 2015-10-06 DIAGNOSIS — M25562 Pain in left knee: Secondary | ICD-10-CM

## 2015-10-06 DIAGNOSIS — M21612 Bunion of left foot: Secondary | ICD-10-CM

## 2015-10-06 DIAGNOSIS — M216X2 Other acquired deformities of left foot: Secondary | ICD-10-CM

## 2015-10-06 NOTE — Progress Notes (Signed)
Patient ID: Shannon Mann, female   DOB: [birth year], 49 y.o.   MRN: [id number]  Patient was seen last week for left medial knee pain She appeared to have some tenderness over the capsule and the medial patellofemoral ligament Shows small avulsion of the upper outer patella I felt this was likely related to her biomechanics during running gait  She returns today with new running shoes for gait evaluation She has not tried a compression sleeve yet She was able to run 2 miles with some mild knee pain  Physical examination Pleasant female in no acute distress, athletic BP 105/58 mmHg  Pulse 87  Ht 5\' 8"  (1.727 m)  Wt 140 lb (63.504 kg)  BMI 21.29 kg/m2  Left knee with no swelling No real tenderness to palpation today Good patellar tracking  Running gait reveals pronation and medial foot strike Poor push off from a bunion on the left foot Dynamic rapid genu-valgus of the left knee only  Review of systems No other joint pain or swelling No redness or warmth over any joints

## 2015-10-06 NOTE — Assessment & Plan Note (Signed)
We added a cushioned pad for the bunion of her left foot

## 2015-10-06 NOTE — Assessment & Plan Note (Signed)
We added a sports insole to give her arch support On the left we added a scaphoid pad On the right we left the arch and a standard height  Running gait on completion of this showed less genu valgus The left knee does not cross the midline after correction  To assess for 6 weeks If this is helping her any pain she should continue If not we should consider custom orthotics

## 2015-10-06 NOTE — Assessment & Plan Note (Signed)
Suspect capsular strain  Monitor her response to changing of her gait

## 2016-09-11 ENCOUNTER — Encounter: Payer: Self-pay | Admitting: Sports Medicine

## 2016-09-11 ENCOUNTER — Ambulatory Visit (INDEPENDENT_AMBULATORY_CARE_PROVIDER_SITE_OTHER): Payer: BLUE CROSS/BLUE SHIELD | Admitting: Sports Medicine

## 2016-09-11 DIAGNOSIS — M216X9 Other acquired deformities of unspecified foot: Secondary | ICD-10-CM

## 2016-09-11 DIAGNOSIS — M21612 Bunion of left foot: Secondary | ICD-10-CM | POA: Diagnosis not present

## 2016-09-11 NOTE — Assessment & Plan Note (Signed)
Orthotic support seems to be helping both with knee pain and hopefully will prevent further foot break down

## 2016-09-11 NOTE — Assessment & Plan Note (Signed)
Custom orthotics made today appeared to improve her running gait

## 2016-09-11 NOTE — Progress Notes (Signed)
Chief complaint; bilateral foot pain with a bunion of the left great toe  Patient has been treated with temporary orthotics for bilateral foot pain She gets some relief with these but would like to try custom orthotics so that she can walk and exercise more  Currently less pain is over the left great toe There is some pain over the right longitudinal arch and midfoot at times  Use of temporary orthotics didn't help relieve some of her knee pain  Social history nonsmoker  Past history reviewed  Review of systems Denies any numbness or tingling in the feet No swelling of the ankles Periodic redness and tenderness of the left great toe joint  Physical examination Pleasant muscular female in no acute distress BP (!) 105/54   Ht 5' 7.5" (1.715 m)   Wt 140 lb (63.5 kg)   BMI 21.60 kg/m   Standing shows a mild subluxation of the right foot on articulation at the subtalar joint Mild calcaneal valgus Out turning of the right foot  Bunion of the left foot Mild tenderness to palpation No swelling today  Moderately high arches bilaterally with widened 4 feet  PRE_  orthotic gait Out turning of the right foot Rear foot pronation with calcaneal valgus Dynamic forefoot pronation after push off  On the left foot strike his neutral Increased forefoot valgus  Patient was fitted for a : standard, cushioned, semi-rigid orthotic. The orthotic was heated and afterward the patient stood on the orthotic blank positioned on the orthotic stand. The patient was positioned in subtalar neutral position and 10 degrees of ankle dorsiflexion in a weight bearing stance. After completion of molding, a stable base was applied to the orthotic blank. The blank was ground to a stable position for weight bearing. Size: 8 red EVA Base: blue Med EVA Posting: left medial heel wedge Additional orthotic padding: bunion padding  Post orthotic gait We were able to correct the out turning of the right foot  in both the rear and forefoot pronation Bunion padding appeared to correct the forefoot valgus on the left She had good comfort running in these  Assessment and plan  1.Bunion Custom orthotic correction made today  2. Cavus deformity of feet Strong evidence supports use of custom orthotics and cavus feet so I suspect she will need to use these and deftly  I spent 40 minutes face-to-face with the patient for preparation of her orthotics and counseling on use of these for correction of abnormal gait and prevention of additional problems with her bunions.  Ila Mcgill, MD

## 2016-09-18 DIAGNOSIS — H9193 Unspecified hearing loss, bilateral: Secondary | ICD-10-CM | POA: Insufficient documentation

## 2016-09-18 DIAGNOSIS — H6983 Other specified disorders of Eustachian tube, bilateral: Secondary | ICD-10-CM | POA: Insufficient documentation

## 2017-07-01 ENCOUNTER — Encounter: Payer: Self-pay | Admitting: Podiatry

## 2017-07-01 ENCOUNTER — Ambulatory Visit (INDEPENDENT_AMBULATORY_CARE_PROVIDER_SITE_OTHER): Payer: 59

## 2017-07-01 ENCOUNTER — Ambulatory Visit (INDEPENDENT_AMBULATORY_CARE_PROVIDER_SITE_OTHER): Payer: 59 | Admitting: Podiatry

## 2017-07-01 DIAGNOSIS — M2012 Hallux valgus (acquired), left foot: Secondary | ICD-10-CM | POA: Diagnosis not present

## 2017-07-01 DIAGNOSIS — M722 Plantar fascial fibromatosis: Secondary | ICD-10-CM

## 2017-07-01 DIAGNOSIS — B351 Tinea unguium: Secondary | ICD-10-CM | POA: Diagnosis not present

## 2017-07-01 MED ORDER — MELOXICAM 15 MG PO TABS: 15.0000 mg | ORAL_TABLET | Freq: Every day | ORAL | 2 refills | Status: AC

## 2017-07-01 NOTE — Progress Notes (Signed)
   Subjective:    Patient ID: Shannon Mann, female    DOB: 10-10-1966, 51 y.o.   MRN: [id number]  HPI  Chief Complaint  Patient presents with  . Plantar Fasciitis    both feet more on rt. heel and arch pain onset 1 month  . Bunions    left foot for years    51 year old female presents the office today for concerns of bilateral heel and arch pain which has been ongoing for about 1 month. Her right is worse in the left. She states that she has pain after rest which she first starts walking it is relieved by activity. She's been stretching as well as of the foot roller. She states this started after wearing all shoes. No numbness or tingling. Also concerned about nail discoloration. She also has a bunion on the foot which is not painful this time.  Workup at night.  Review of Systems  Musculoskeletal: Positive for gait problem.  All other systems reviewed and are negative.      Objective:   Physical Exam General: AAO x3, NAD  Dermatological: Skin is warm, dry and supple bilateral. Nails are somewhat discolored, dystrophic. No pain the nails there is no cellulitis or drainage. There are no open sores, no preulcerative lesions, no rash or signs of infection present.  Vascular: Dorsalis Pedis artery and Posterior Tibial artery pedal pulses are 2/4 bilateral with immedate capillary fill time. Pedal hair growth present. No varicosities and no lower extremity edema present bilateral. There is no pain with calf compression, swelling, warmth, erythema.   Neruologic: Grossly intact via light touch bilateral. Protective threshold with Semmes Wienstein monofilament intact to all pedal sites bilateral. Negative smell sign.  Musculoskeletal: Tenderness to palpation along the plantar medial tubercle of the calcaneus at the insertion of plantar fascia on the right > left foot. There is no pain along the course of the plantar fascia within the arch of the foot. Plantar fascia appears to be intact. There  is no pain with lateral compression of the calcaneus or pain with vibratory sensation. There is no pain along the course or insertion of the achilles tendon. No other areas of tenderness to bilateral lower extremities.Muscular strength 5/5 in all groups tested bilateral. HAV present left foot.  Gait: Unassisted, Nonantalgic.      Assessment & Plan:  51 year old female with bilateral heel pain likely plantar fasciitis, onychomycosis, HAV -Treatment options discussed including all alternatives, risks, and complications -Etiology of symptoms were discussed -X-rays were obtained and reviewed with the patient. No evidence of acute fracture identified. HAV is present. -Prescribed mobic. Discussed side effects of the medication and directed to stop if any are to occur and call the office.  -Plantar fascial brace was dispensed. -Stretching, icing exercises. -Discussed shoegear modifications and orthotics for both plantar fasciitis and the bunion -Discussed treatment options for onychomycosis. -RTC 3 weeks  Celesta Gentile, DPM

## 2017-07-01 NOTE — Patient Instructions (Signed)

## 2017-07-04 DIAGNOSIS — M722 Plantar fascial fibromatosis: Secondary | ICD-10-CM | POA: Insufficient documentation

## 2017-07-22 ENCOUNTER — Ambulatory Visit: Payer: 59 | Admitting: Podiatry

## 2017-07-29 ENCOUNTER — Ambulatory Visit: Payer: 59 | Admitting: Podiatry

## 2017-08-02 ENCOUNTER — Ambulatory Visit: Payer: 59 | Admitting: Podiatry

## 2017-08-02 ENCOUNTER — Ambulatory Visit (INDEPENDENT_AMBULATORY_CARE_PROVIDER_SITE_OTHER): Payer: 59 | Admitting: Podiatry

## 2017-08-02 ENCOUNTER — Encounter: Payer: Self-pay | Admitting: Podiatry

## 2017-08-02 DIAGNOSIS — M722 Plantar fascial fibromatosis: Secondary | ICD-10-CM | POA: Diagnosis not present

## 2017-08-04 NOTE — Progress Notes (Signed)
Subjective: Shannon Mann presents to the office today for follow-up evaluation of bilateral heel pain. They state that they are doing better but they are not not completely well yet. They have been icing, stretching, try to wear supportive shoe as much as possible. She says the plantar fascial brace has been helping. The majority of symptoms that she is getting is still in the morning when she first gets up. She states that she's able to do activities without much discomfort at this point. No other complaints at this time. No acute changes since last appointment. They deny any systemic complaints such as fevers, chills, nausea, vomiting.  Objective: General: AAO x3, NAD  Dermatological: Skin is warm, dry and supple bilateral. Nails x 10 are well manicured; remaining integument appears unremarkable at this time. There are no open sores, no preulcerative lesions, no rash or signs of infection present.  Vascular: Dorsalis Pedis artery and Posterior Tibial artery pedal pulses are 2/4 bilateral with immedate capillary fill time. Pedal hair growth present. There is no pain with calf compression, swelling, warmth, erythema.   Neruologic: Grossly intact via light touch bilateral. Vibratory intact via tuning fork bilateral. Protective threshold with Semmes Wienstein monofilament intact to all pedal sites bilateral.   Musculoskeletal: There is mild continue tenderness palpation along the plantar medial tubercle of the calcaneus at the insertion of the plantar fascia on the right foot. There is no pain along the course of the plantar fascia within the arch of the foot. Plantar fascia appears to be intact bilaterally. There is no pain with lateral compression of the calcaneus and there is no pain with vibratory sensation. There is no pain along the course or insertion of the Achilles tendon. There are no other areas of tenderness to bilateral lower extremities. No gross boney pedal deformities bilateral. No pain,  crepitus, or limitation noted with foot and ankle range of motion bilateral. Muscular strength 5/5 in all groups tested bilateral.  Gait: Unassisted, Nonantalgic.   Assessment: Presents for follow-up evaluation for heel pain, likely plantar fasciitis   Plan: -Treatment options discussed including all alternatives, risks, and complications -Discussed steroid injection but she wishes to hold off. -Given the morning pain medication benefit more from a night splint was dispensed today. -Ice and stretching exercises on a daily basis. -Continue supportive shoe gear. Also discuss orthotics which she has at home. She is*with which she has. However discussed with her that if they are not fitting appropriately oriented to be adjusted limits no. -Follow-up in 4 weeks if symptoms continue or sooner if any problems arise. In the meantime, encouraged to call the office with any questions, concerns, change in symptoms.   Celesta Gentile, DPM

## 2017-08-30 ENCOUNTER — Ambulatory Visit (INDEPENDENT_AMBULATORY_CARE_PROVIDER_SITE_OTHER): Payer: 59 | Admitting: Podiatry

## 2017-08-30 ENCOUNTER — Encounter: Payer: Self-pay | Admitting: Podiatry

## 2017-08-30 DIAGNOSIS — M722 Plantar fascial fibromatosis: Secondary | ICD-10-CM

## 2017-08-30 MED ORDER — METHYLPREDNISOLONE 4 MG PO TBPK: ORAL_TABLET | ORAL | 0 refills | Status: DC

## 2017-09-02 NOTE — Progress Notes (Signed)
Subjective: Shannon Mann presents to the office today for follow-up evaluation of bilateral heel pain. She states that the brace does help but she still gets some mild discomfort consistently. She states that it is not terrible but she can still feel it. She denies any numbness or tingling she denies any pain at night that wakes her up. She is also been stretching, icing. She's been taking the anti-inflammatory intermittently. No other complaints at this time. No acute changes since last appointment. They deny any systemic complaints such as fevers, chills, nausea, vomiting.  Objective: General: AAO x3, NAD  Dermatological: Skin is warm, dry and supple bilateral. There are no open sores, no preulcerative lesions, no rash or signs of infection present.  Vascular: Dorsalis Pedis artery and Posterior Tibial artery pedal pulses are 2/4 bilateral with immedate capillary fill time. There is no pain with calf compression, swelling, warmth, erythema.   Neruologic: Grossly intact via light touch bilateral. Negative spell sign.  Musculoskeletal: There is mild continue tenderness palpation along the plantar medial tubercle of the calcaneus at the insertion of the plantar fascia on the right foot. There is no pain along the course of the plantar fascia within the arch of the foot. Plantar fascia appears to be intact bilaterally. There is no pain with lateral compression of the calcaneus and there is no pain with vibratory sensation. There is no pain along the course or insertion of the Achilles tendon. There are no other areas of tenderness to bilateral lower extremities. No gross boney pedal deformities bilateral. No pain, crepitus, or limitation noted with foot and ankle range of motion bilateral. Muscular strength 5/5 in all groups tested bilateral.  Gait: Unassisted, Nonantalgic.   Assessment: Presents for follow-up evaluation for heel pain, likely plantar fasciitis with continued  symptoms  Plan: -Treatment options discussed including all alternatives, risks, and complications -Discussed steroid injection but she wishes to hold off prescribed a Medrol Dosepak to stay. Once this was complete she can restart meloxicam but not together. -Continue with the stretching, icing as well as a night splint. At this point discussed further treatment options including EPAT. She states that the steroid does not help then she will do this treatment. I gave her literature about this today as well as discussed the success rates and this is not a guarantee and she understands this. -Plantar fascial taking was applied today.  -Follow-up in 4 weeks if symptoms continue or sooner if any problems arise. In the meantime, encouraged to call the office with any questions, concerns, change in symptoms.   Celesta Gentile, DPM

## 2017-09-09 ENCOUNTER — Encounter: Payer: Self-pay | Admitting: Podiatry

## 2017-09-09 ENCOUNTER — Ambulatory Visit (INDEPENDENT_AMBULATORY_CARE_PROVIDER_SITE_OTHER): Payer: 59 | Admitting: Podiatry

## 2017-09-09 DIAGNOSIS — M722 Plantar fascial fibromatosis: Secondary | ICD-10-CM

## 2017-09-09 DIAGNOSIS — Z23 Encounter for immunization: Secondary | ICD-10-CM | POA: Diagnosis not present

## 2017-09-11 NOTE — Progress Notes (Signed)
Subjective: 51 year old female presents the office today requesting to start EPAT treatment today. He states his medications does help as well as the steroids. She states that she is having minimal pain when she was having the taping as well as the steroids when she finished pain started to come back. This point so as to have pursued further treatment options. She does of the right foot hurts more than the left usually the posterior on the right foot. She denies any numbness or tingling she denies any recent injury or trauma. She states that if she had a live with the pain she could but she describes a dull nagging pain in the bottom of her heels. Denies any systemic complaints such as fevers, chills, nausea, vomiting. No acute changes since last appointment, and no other complaints at this time.   Objective: AAO x3, NAD DP/PT pulses palpable bilaterally, CRT less than 3 seconds There is continued tenderness to palpation along the plantar medial tubercle of the calcaneus at the insertion of plantar fascia on the right > left foot. There is no pain along the course of the plantar fascia within the arch of the foot. Plantar fascia appears to be intact. There is no pain with lateral compression of the calcaneus or pain with vibratory sensation. There is no pain along the course or insertion of the achilles tendon. No other areas of tenderness to bilateral lower extremities.Negative tinel sign No open lesions or pre-ulcerative lesions.  No pain with calf compression, swelling, warmth, erythema  Assessment: Chronic heel pain, plantar fasciitis right side worse on left  Plan: -All treatment options discussed with the patient including all alternatives, risks, complications.  -Discussed further treatment options today including EPAT. After discussion she wishes to proceed with this understanding this is not a guarantee of resolution of symptoms. She still has continue the stretching as well as the inserts as  well as supportive shoe gear. After the treatment would hold off on any icing or anti-inflammatories. -Patient completed her first treatment without any complications for total of 8.6 J -Short cam boot was dispensed from whoever last couple days to help prevent increased pain -Follow-up in 1 week with Janett Billow for her second treatment. -Patient encouraged to call the office with any questions, concerns, change in symptoms.   Celesta Gentile, DPM

## 2017-09-16 ENCOUNTER — Ambulatory Visit (INDEPENDENT_AMBULATORY_CARE_PROVIDER_SITE_OTHER): Payer: Self-pay | Admitting: Podiatry

## 2017-09-16 DIAGNOSIS — M2012 Hallux valgus (acquired), left foot: Secondary | ICD-10-CM

## 2017-09-16 DIAGNOSIS — M722 Plantar fascial fibromatosis: Secondary | ICD-10-CM

## 2017-09-17 NOTE — Progress Notes (Signed)
Patient presents with chronic heel pain in her Rt foot. She has tried various treatments with no relief.   Pain on palpation to Rt medial heel   ESWT administered to area for 9 joules. EPAT administered to area. Advised on usage of boot and avoidance of ice and NSAIDs. Re-appointed in 1 week for 3rd treatment

## 2017-09-23 ENCOUNTER — Ambulatory Visit (INDEPENDENT_AMBULATORY_CARE_PROVIDER_SITE_OTHER): Payer: 59 | Admitting: Podiatry

## 2017-09-23 DIAGNOSIS — B351 Tinea unguium: Secondary | ICD-10-CM

## 2017-09-23 DIAGNOSIS — M722 Plantar fascial fibromatosis: Secondary | ICD-10-CM

## 2017-09-23 NOTE — Progress Notes (Signed)
Patient presents with chronic heel pain in her Rt foot. She has tried various treatments with no relief.   Pain on palpation to Rt medial heel   ESWT administered to area for 9 joules. EPAT administered to area. Advised on usage of boot and avoidance of ice and NSAIDs. Re-appointed in 4 weeks for 4th treatment

## 2017-10-21 ENCOUNTER — Ambulatory Visit: Payer: 59

## 2017-12-24 DIAGNOSIS — F4320 Adjustment disorder, unspecified: Secondary | ICD-10-CM | POA: Diagnosis not present

## 2017-12-26 ENCOUNTER — Ambulatory Visit (INDEPENDENT_AMBULATORY_CARE_PROVIDER_SITE_OTHER): Payer: BLUE CROSS/BLUE SHIELD | Admitting: Podiatry

## 2017-12-26 ENCOUNTER — Encounter: Payer: Self-pay | Admitting: Podiatry

## 2017-12-26 DIAGNOSIS — M722 Plantar fascial fibromatosis: Secondary | ICD-10-CM

## 2017-12-26 MED ORDER — DICLOFENAC SODIUM 75 MG PO TBEC: 75.0000 mg | DELAYED_RELEASE_TABLET | Freq: Two times a day (BID) | ORAL | 0 refills | Status: DC

## 2017-12-29 NOTE — Progress Notes (Signed)
Subjective: Shannon Mann presents the office today for concerns of right heel pain.  She is to the left side is doing much better.  Overall her right foot is better but she still having pain.  She has previously had EPAT and she has been doing the stretching, icing on a intermittent basis.  She is also been wearing inserts inside of her shoes.  She has never had an injection and she like to discuss this today.  No recent injury or trauma.  No swelling or redness.  The pain does not wake her up at night.  She has no other concerns today. Denies any systemic complaints such as fevers, chills, nausea, vomiting. No acute changes since last appointment, and no other complaints at this time.   Objective: AAO x3, NAD DP/PT pulses palpable bilaterally, CRT less than 3 seconds There is tenderness to palpation along the plantar medial tubercle of the calcaneus at the insertion of plantar fascia on the right foot but there is no pain on the left. There is no pain along the course of the plantar fascia within the arch of the foot. Plantar fascia appears to be intact. There is no pain with lateral compression of the calcaneus or pain with vibratory sensation. There is no pain along the course or insertion of the achilles tendon. No other areas of tenderness to bilateral lower extremities. Negative tinel sign.  No open lesions or pre-ulcerative lesions.  No pain with calf compression, swelling, warmth, erythema  Assessment: Right heel pain, plantar fasciitis  Plan: -All treatment options discussed with the patient including all alternatives, risks, complications.  -Today we discussed various treatment options.  She has had some intermittent treatment over the last couple months but nothing we have done has been consecutive.  We discussed a steroid injection she wishes to proceed with this today.  See procedure note below.  Also plantar fascial taping was applied as this is helped.  She requested this be done bilaterally.   Also want her to continue with stretching, icing on a regular basis.  Hold off on high impact activity.  Discussed the change in shoes as well.  We discussed physical therapy.  Discussed custom orthotic  Procedure: Injection Tendon/Ligament Discussed alternatives, risks, complications and verbal consent was obtained.  Location: Right plantar fascia at the glabrous junction; medial approach. Skin Prep: Alcohol. Injectate: 1 cc 0.5% marcaine plain, 1 cc 0.5% Marcaine plain and, 1 cc kenalog 10. Disposition: Patient tolerated procedure well. Injection site dressed with a band-aid.  Post-injection care was discussed and return precautions discussed.   -Patient encouraged to call the office with any questions, concerns, change in symptoms.   Trula Slade DPM

## 2018-01-08 DIAGNOSIS — R269 Unspecified abnormalities of gait and mobility: Secondary | ICD-10-CM | POA: Diagnosis not present

## 2018-01-08 DIAGNOSIS — M722 Plantar fascial fibromatosis: Secondary | ICD-10-CM | POA: Diagnosis not present

## 2018-01-08 DIAGNOSIS — M25571 Pain in right ankle and joints of right foot: Secondary | ICD-10-CM | POA: Diagnosis not present

## 2018-01-08 DIAGNOSIS — M25671 Stiffness of right ankle, not elsewhere classified: Secondary | ICD-10-CM | POA: Diagnosis not present

## 2018-01-14 DIAGNOSIS — M25571 Pain in right ankle and joints of right foot: Secondary | ICD-10-CM | POA: Diagnosis not present

## 2018-01-14 DIAGNOSIS — R269 Unspecified abnormalities of gait and mobility: Secondary | ICD-10-CM | POA: Diagnosis not present

## 2018-01-14 DIAGNOSIS — M25671 Stiffness of right ankle, not elsewhere classified: Secondary | ICD-10-CM | POA: Diagnosis not present

## 2018-01-14 DIAGNOSIS — M722 Plantar fascial fibromatosis: Secondary | ICD-10-CM | POA: Diagnosis not present

## 2018-01-21 DIAGNOSIS — M25671 Stiffness of right ankle, not elsewhere classified: Secondary | ICD-10-CM | POA: Diagnosis not present

## 2018-01-21 DIAGNOSIS — R269 Unspecified abnormalities of gait and mobility: Secondary | ICD-10-CM | POA: Diagnosis not present

## 2018-01-21 DIAGNOSIS — M25571 Pain in right ankle and joints of right foot: Secondary | ICD-10-CM | POA: Diagnosis not present

## 2018-01-21 DIAGNOSIS — M722 Plantar fascial fibromatosis: Secondary | ICD-10-CM | POA: Diagnosis not present

## 2018-01-23 DIAGNOSIS — M722 Plantar fascial fibromatosis: Secondary | ICD-10-CM | POA: Diagnosis not present

## 2018-01-23 DIAGNOSIS — R269 Unspecified abnormalities of gait and mobility: Secondary | ICD-10-CM | POA: Diagnosis not present

## 2018-01-23 DIAGNOSIS — M25571 Pain in right ankle and joints of right foot: Secondary | ICD-10-CM | POA: Diagnosis not present

## 2018-01-23 DIAGNOSIS — M25671 Stiffness of right ankle, not elsewhere classified: Secondary | ICD-10-CM | POA: Diagnosis not present

## 2018-01-30 DIAGNOSIS — M25571 Pain in right ankle and joints of right foot: Secondary | ICD-10-CM | POA: Diagnosis not present

## 2018-01-30 DIAGNOSIS — R269 Unspecified abnormalities of gait and mobility: Secondary | ICD-10-CM | POA: Diagnosis not present

## 2018-01-30 DIAGNOSIS — M25671 Stiffness of right ankle, not elsewhere classified: Secondary | ICD-10-CM | POA: Diagnosis not present

## 2018-01-30 DIAGNOSIS — M722 Plantar fascial fibromatosis: Secondary | ICD-10-CM | POA: Diagnosis not present

## 2018-02-04 DIAGNOSIS — M25671 Stiffness of right ankle, not elsewhere classified: Secondary | ICD-10-CM | POA: Diagnosis not present

## 2018-02-04 DIAGNOSIS — M25571 Pain in right ankle and joints of right foot: Secondary | ICD-10-CM | POA: Diagnosis not present

## 2018-02-04 DIAGNOSIS — R269 Unspecified abnormalities of gait and mobility: Secondary | ICD-10-CM | POA: Diagnosis not present

## 2018-02-04 DIAGNOSIS — M722 Plantar fascial fibromatosis: Secondary | ICD-10-CM | POA: Diagnosis not present

## 2018-02-06 DIAGNOSIS — R269 Unspecified abnormalities of gait and mobility: Secondary | ICD-10-CM | POA: Diagnosis not present

## 2018-02-06 DIAGNOSIS — M25571 Pain in right ankle and joints of right foot: Secondary | ICD-10-CM | POA: Diagnosis not present

## 2018-02-06 DIAGNOSIS — M722 Plantar fascial fibromatosis: Secondary | ICD-10-CM | POA: Diagnosis not present

## 2018-02-06 DIAGNOSIS — M25671 Stiffness of right ankle, not elsewhere classified: Secondary | ICD-10-CM | POA: Diagnosis not present

## 2018-02-11 DIAGNOSIS — R269 Unspecified abnormalities of gait and mobility: Secondary | ICD-10-CM | POA: Diagnosis not present

## 2018-02-11 DIAGNOSIS — M722 Plantar fascial fibromatosis: Secondary | ICD-10-CM | POA: Diagnosis not present

## 2018-02-11 DIAGNOSIS — M25571 Pain in right ankle and joints of right foot: Secondary | ICD-10-CM | POA: Diagnosis not present

## 2018-02-11 DIAGNOSIS — M25671 Stiffness of right ankle, not elsewhere classified: Secondary | ICD-10-CM | POA: Diagnosis not present

## 2018-02-13 DIAGNOSIS — M25671 Stiffness of right ankle, not elsewhere classified: Secondary | ICD-10-CM | POA: Diagnosis not present

## 2018-02-13 DIAGNOSIS — M722 Plantar fascial fibromatosis: Secondary | ICD-10-CM | POA: Diagnosis not present

## 2018-02-13 DIAGNOSIS — M25571 Pain in right ankle and joints of right foot: Secondary | ICD-10-CM | POA: Diagnosis not present

## 2018-02-13 DIAGNOSIS — R269 Unspecified abnormalities of gait and mobility: Secondary | ICD-10-CM | POA: Diagnosis not present

## 2018-02-18 DIAGNOSIS — M722 Plantar fascial fibromatosis: Secondary | ICD-10-CM | POA: Diagnosis not present

## 2018-02-18 DIAGNOSIS — R269 Unspecified abnormalities of gait and mobility: Secondary | ICD-10-CM | POA: Diagnosis not present

## 2018-02-18 DIAGNOSIS — M25571 Pain in right ankle and joints of right foot: Secondary | ICD-10-CM | POA: Diagnosis not present

## 2018-02-18 DIAGNOSIS — M25671 Stiffness of right ankle, not elsewhere classified: Secondary | ICD-10-CM | POA: Diagnosis not present

## 2018-02-20 DIAGNOSIS — R269 Unspecified abnormalities of gait and mobility: Secondary | ICD-10-CM | POA: Diagnosis not present

## 2018-02-20 DIAGNOSIS — M722 Plantar fascial fibromatosis: Secondary | ICD-10-CM | POA: Diagnosis not present

## 2018-02-20 DIAGNOSIS — M25671 Stiffness of right ankle, not elsewhere classified: Secondary | ICD-10-CM | POA: Diagnosis not present

## 2018-02-20 DIAGNOSIS — M25571 Pain in right ankle and joints of right foot: Secondary | ICD-10-CM | POA: Diagnosis not present

## 2018-02-24 DIAGNOSIS — M25671 Stiffness of right ankle, not elsewhere classified: Secondary | ICD-10-CM | POA: Diagnosis not present

## 2018-02-24 DIAGNOSIS — R269 Unspecified abnormalities of gait and mobility: Secondary | ICD-10-CM | POA: Diagnosis not present

## 2018-02-24 DIAGNOSIS — M722 Plantar fascial fibromatosis: Secondary | ICD-10-CM | POA: Diagnosis not present

## 2018-02-24 DIAGNOSIS — M25571 Pain in right ankle and joints of right foot: Secondary | ICD-10-CM | POA: Diagnosis not present

## 2018-02-27 DIAGNOSIS — M722 Plantar fascial fibromatosis: Secondary | ICD-10-CM | POA: Diagnosis not present

## 2018-02-27 DIAGNOSIS — R269 Unspecified abnormalities of gait and mobility: Secondary | ICD-10-CM | POA: Diagnosis not present

## 2018-02-27 DIAGNOSIS — M25571 Pain in right ankle and joints of right foot: Secondary | ICD-10-CM | POA: Diagnosis not present

## 2018-02-27 DIAGNOSIS — M25671 Stiffness of right ankle, not elsewhere classified: Secondary | ICD-10-CM | POA: Diagnosis not present

## 2018-03-11 DIAGNOSIS — M722 Plantar fascial fibromatosis: Secondary | ICD-10-CM | POA: Diagnosis not present

## 2018-03-11 DIAGNOSIS — M25571 Pain in right ankle and joints of right foot: Secondary | ICD-10-CM | POA: Diagnosis not present

## 2018-03-11 DIAGNOSIS — R269 Unspecified abnormalities of gait and mobility: Secondary | ICD-10-CM | POA: Diagnosis not present

## 2018-03-11 DIAGNOSIS — M25671 Stiffness of right ankle, not elsewhere classified: Secondary | ICD-10-CM | POA: Diagnosis not present

## 2018-03-12 DIAGNOSIS — R269 Unspecified abnormalities of gait and mobility: Secondary | ICD-10-CM | POA: Diagnosis not present

## 2018-03-12 DIAGNOSIS — M25571 Pain in right ankle and joints of right foot: Secondary | ICD-10-CM | POA: Diagnosis not present

## 2018-03-12 DIAGNOSIS — M25671 Stiffness of right ankle, not elsewhere classified: Secondary | ICD-10-CM | POA: Diagnosis not present

## 2018-03-12 DIAGNOSIS — M722 Plantar fascial fibromatosis: Secondary | ICD-10-CM | POA: Diagnosis not present

## 2018-03-18 DIAGNOSIS — M722 Plantar fascial fibromatosis: Secondary | ICD-10-CM | POA: Diagnosis not present

## 2018-03-18 DIAGNOSIS — M25671 Stiffness of right ankle, not elsewhere classified: Secondary | ICD-10-CM | POA: Diagnosis not present

## 2018-03-18 DIAGNOSIS — R269 Unspecified abnormalities of gait and mobility: Secondary | ICD-10-CM | POA: Diagnosis not present

## 2018-03-18 DIAGNOSIS — M25571 Pain in right ankle and joints of right foot: Secondary | ICD-10-CM | POA: Diagnosis not present

## 2018-03-25 ENCOUNTER — Ambulatory Visit (INDEPENDENT_AMBULATORY_CARE_PROVIDER_SITE_OTHER): Payer: BLUE CROSS/BLUE SHIELD | Admitting: Orthotics

## 2018-03-25 DIAGNOSIS — M2012 Hallux valgus (acquired), left foot: Secondary | ICD-10-CM

## 2018-03-25 DIAGNOSIS — M722 Plantar fascial fibromatosis: Secondary | ICD-10-CM | POA: Diagnosis not present

## 2018-03-25 NOTE — Progress Notes (Signed)

## 2018-04-15 ENCOUNTER — Ambulatory Visit: Payer: BLUE CROSS/BLUE SHIELD | Admitting: Orthotics

## 2018-04-15 DIAGNOSIS — Q667 Congenital pes cavus, unspecified foot: Secondary | ICD-10-CM

## 2018-04-15 NOTE — Progress Notes (Signed)
Patient came in today to pick up custom made foot orthotics.  The goals were accomplished and the patient reported no dissatisfaction with said orthotics.  Patient was advised of breakin period and how to report any issues.  Patient was concerned that f/o did not fit well into heel of shoe and was causing slippage.  Told her to try them and to bring back for adjustments to fit better in heel.

## 2018-04-29 ENCOUNTER — Other Ambulatory Visit: Payer: BLUE CROSS/BLUE SHIELD | Admitting: Orthotics

## 2018-05-07 ENCOUNTER — Other Ambulatory Visit: Payer: BLUE CROSS/BLUE SHIELD | Admitting: Orthotics

## 2018-10-03 DIAGNOSIS — Z23 Encounter for immunization: Secondary | ICD-10-CM | POA: Diagnosis not present

## 2019-03-04 ENCOUNTER — Ambulatory Visit: Payer: BLUE CROSS/BLUE SHIELD | Admitting: Orthotics

## 2019-03-04 ENCOUNTER — Other Ambulatory Visit: Payer: Self-pay

## 2019-03-04 DIAGNOSIS — M722 Plantar fascial fibromatosis: Secondary | ICD-10-CM

## 2019-03-04 DIAGNOSIS — M2012 Hallux valgus (acquired), left foot: Secondary | ICD-10-CM

## 2019-03-04 DIAGNOSIS — Q667 Congenital pes cavus, unspecified foot: Secondary | ICD-10-CM

## 2019-03-04 NOTE — Progress Notes (Signed)
Patient just wanted f/o adjusted before she gets new ones; I excavated material from arch and added a bit of a lift

## 2019-08-06 ENCOUNTER — Other Ambulatory Visit: Payer: Self-pay

## 2019-08-06 DIAGNOSIS — R6889 Other general symptoms and signs: Secondary | ICD-10-CM | POA: Diagnosis not present

## 2019-08-06 DIAGNOSIS — Z20822 Contact with and (suspected) exposure to covid-19: Secondary | ICD-10-CM

## 2019-08-11 ENCOUNTER — Other Ambulatory Visit: Payer: Self-pay

## 2019-08-11 DIAGNOSIS — Z20822 Contact with and (suspected) exposure to covid-19: Secondary | ICD-10-CM

## 2019-08-11 DIAGNOSIS — R6889 Other general symptoms and signs: Secondary | ICD-10-CM | POA: Diagnosis not present

## 2019-08-13 LAB — NOVEL CORONAVIRUS, NAA: SARS-CoV-2, NAA: NOT DETECTED

## 2019-09-19 DIAGNOSIS — Z23 Encounter for immunization: Secondary | ICD-10-CM | POA: Diagnosis not present

## 2019-11-21 DIAGNOSIS — Z20828 Contact with and (suspected) exposure to other viral communicable diseases: Secondary | ICD-10-CM | POA: Diagnosis not present

## 2019-11-23 ENCOUNTER — Other Ambulatory Visit: Payer: Self-pay

## 2019-11-23 DIAGNOSIS — Z20822 Contact with and (suspected) exposure to covid-19: Secondary | ICD-10-CM

## 2019-11-24 LAB — NOVEL CORONAVIRUS, NAA: SARS-CoV-2, NAA: NOT DETECTED

## 2019-12-25 ENCOUNTER — Ambulatory Visit: Payer: BC Managed Care – PPO | Admitting: Podiatry

## 2020-01-04 ENCOUNTER — Other Ambulatory Visit: Payer: Self-pay

## 2020-01-04 ENCOUNTER — Ambulatory Visit (INDEPENDENT_AMBULATORY_CARE_PROVIDER_SITE_OTHER): Payer: BC Managed Care – PPO

## 2020-01-04 ENCOUNTER — Ambulatory Visit (INDEPENDENT_AMBULATORY_CARE_PROVIDER_SITE_OTHER): Payer: BC Managed Care – PPO | Admitting: Podiatry

## 2020-01-04 DIAGNOSIS — M2012 Hallux valgus (acquired), left foot: Secondary | ICD-10-CM

## 2020-01-04 NOTE — Patient Instructions (Signed)
The surgery we talked about is called a "Lapidus" bunionectomy    Bunion  A bunion is a bump on the base of the big toe that forms when the bones of the big toe joint move out of position. Bunions may be small at first, but they often get larger over time. They can make walking painful. What are the causes? A bunion may be caused by:  Wearing narrow or pointed shoes that force the big toe to press against the other toes.  Abnormal foot development that causes the foot to roll inward (pronate).  Changes in the foot that are caused by certain diseases, such as rheumatoid arthritis or polio.  A foot injury. What increases the risk? The following factors may make you more likely to develop this condition:  Wearing shoes that squeeze the toes together.  Having certain diseases, such as: ? Rheumatoid arthritis. ? Polio. ? Cerebral palsy.  Having family members who have bunions.  Being born with a foot deformity, such as flat feet or low arches.  Doing activities that put a lot of pressure on the feet, such as ballet dancing. What are the signs or symptoms? The main symptom of a bunion is a noticeable bump on the big toe. Other symptoms may include:  Pain.  Swelling around the big toe.  Redness and inflammation.  Thick or hardened skin on the big toe or between the toes.  Stiffness or loss of motion in the big toe.  Trouble with walking. How is this diagnosed? A bunion may be diagnosed based on your symptoms, medical history, and activities. You may have tests, such as:  X-rays. These allow your health care provider to check the position of the bones in your foot and look for damage to your joint. They also help your health care provider determine the severity of your bunion and the best way to treat it.  Joint aspiration. In this test, a sample of fluid is removed from the toe joint. This test may be done if you are in a lot of pain. It helps rule out diseases that cause  painful swelling of the joints, such as arthritis. How is this treated? Treatment depends on the severity of your symptoms. The goal of treatment is to relieve symptoms and prevent the bunion from getting worse. Your health care provider may recommend:  Wearing shoes that have a wide toe box.  Using bunion pads to cushion the affected area.  Taping your toes together to keep them in a normal position.  Placing a device inside your shoe (orthotics) to help reduce pressure on your toe joint.  Taking medicine to ease pain, inflammation, and swelling.  Applying heat or ice to the affected area.  Doing stretching exercises.  Surgery to remove scar tissue and move the toes back into their normal position. This treatment is rare. Follow these instructions at home: Managing pain, stiffness, and swelling   If directed, put ice on the painful area: ? Put ice in a plastic bag. ? Place a towel between your skin and the bag. ? Leave the ice on for 20 minutes, 2-3 times a day. Activity   If directed, apply heat to the affected area before you exercise. Use the heat source that your health care provider recommends, such as a moist heat pack or a heating pad. ? Place a towel between your skin and the heat source. ? Leave the heat on for 20-30 minutes. ? Remove the heat if your skin turns  bright red. This is especially important if you are unable to feel pain, heat, or cold. You may have a greater risk of getting burned.  Do exercises as told by your health care provider. General instructions  Support your toe joint with proper footwear, shoe padding, or taping as told by your health care provider.  Take over-the-counter and prescription medicines only as told by your health care provider.  Keep all follow-up visits as told by your health care provider. This is important. Contact a health care provider if your symptoms:  Get worse.  Do not improve in 2 weeks. Get help right away if you  have:  Severe pain and trouble with walking. Summary  A bunion is a bump on the base of the big toe that forms when the bones of the big toe joint move out of position.  Bunions can make walking painful.  Treatment depends on the severity of your symptoms.  Support your toe joint with proper footwear, shoe padding, or taping as told by your health care provider. This information is not intended to replace advice given to you by your health care provider. Make sure you discuss any questions you have with your health care provider. Document Revised: 06/09/2018 Document Reviewed: 04/15/2018 Elsevier Patient Education  [place].

## 2020-01-05 DIAGNOSIS — M2012 Hallux valgus (acquired), left foot: Secondary | ICD-10-CM | POA: Insufficient documentation

## 2020-01-05 NOTE — Progress Notes (Signed)
Subjective: 54 year old female presents the office today for concerns of a painful bunion on the left foot.  This been ongoing for some time and she has tried changing shoes, offloading padding with any significant resolution.  She states that she can only wear certain shoes.  She wants to discuss her surgical options at this time.  No recent injury to her foot. Denies any systemic complaints such as fevers, chills, nausea, vomiting. No acute changes since last appointment, and no other complaints at this time.   Objective: AAO x3, NAD DP/PT pulses palpable bilaterally, CRT less than 3 seconds Moderate bunion deformities present and she gets tenderness mostly on the first metatarsal head medially on the bump.  There is hypermobility present of the first ray.  There is no other areas of discomfort identified at this time. No open lesions or pre-ulcerative lesions.  No pain with calf compression, swelling, warmth, erythema  Assessment: Left foot symptomatic bunion deformity  Plan: -All treatment options discussed with the patient including all alternatives, risks, complications.  -X-rays obtained and reviewed.  Moderate bunion deformities present.  There is no evidence of acute fracture identified today. -We discussed both conservative as well as surgical treatment options.  Given the hypermobility present I did discuss with her Lapidus bunionectomy.  We discussed the surgery as well as the postoperative course.  I think we give her better correction help her reoccurrence in the future.  She can consider her options.  For now continue supportive shoes, inserts as well as offloading. -Patient encouraged to call the office with any questions, concerns, change in symptoms.   Trula Slade DPM

## 2020-02-12 ENCOUNTER — Ambulatory Visit: Payer: BC Managed Care – PPO | Attending: Internal Medicine

## 2020-02-12 DIAGNOSIS — Z20822 Contact with and (suspected) exposure to covid-19: Secondary | ICD-10-CM

## 2020-02-13 LAB — NOVEL CORONAVIRUS, NAA: SARS-CoV-2, NAA: NOT DETECTED

## 2020-02-15 ENCOUNTER — Ambulatory Visit: Payer: BC Managed Care – PPO | Attending: Internal Medicine

## 2020-02-15 DIAGNOSIS — Z20822 Contact with and (suspected) exposure to covid-19: Secondary | ICD-10-CM

## 2020-02-16 LAB — NOVEL CORONAVIRUS, NAA: SARS-CoV-2, NAA: NOT DETECTED

## 2020-02-19 ENCOUNTER — Ambulatory Visit: Payer: BC Managed Care – PPO | Attending: Internal Medicine

## 2020-02-19 DIAGNOSIS — Z23 Encounter for immunization: Secondary | ICD-10-CM | POA: Insufficient documentation

## 2020-02-19 NOTE — Progress Notes (Signed)
   Covid-19 Vaccination Clinic  Name:  Shannon Mann    MRN: [id number] DOB: 1966-11-18  02/19/2020  Shannon Mann was observed post Covid-19 immunization for 15 minutes without incident. She was provided with Vaccine Information Sheet and instruction to access the V-Safe system.   Shannon Mann was instructed to call 911 with any severe reactions post vaccine: Marland Kitchen Difficulty breathing  . Swelling of face and throat  . A fast heartbeat  . A bad rash all over body  . Dizziness and weakness

## 2020-02-20 ENCOUNTER — Ambulatory Visit: Payer: BC Managed Care – PPO

## 2020-03-03 DIAGNOSIS — Z1151 Encounter for screening for human papillomavirus (HPV): Secondary | ICD-10-CM | POA: Diagnosis not present

## 2020-03-03 DIAGNOSIS — Z1231 Encounter for screening mammogram for malignant neoplasm of breast: Secondary | ICD-10-CM | POA: Diagnosis not present

## 2020-03-03 DIAGNOSIS — Z01419 Encounter for gynecological examination (general) (routine) without abnormal findings: Secondary | ICD-10-CM | POA: Diagnosis not present

## 2020-03-09 ENCOUNTER — Ambulatory Visit: Payer: BC Managed Care – PPO | Attending: Internal Medicine

## 2020-03-09 DIAGNOSIS — Z20822 Contact with and (suspected) exposure to covid-19: Secondary | ICD-10-CM

## 2020-03-10 LAB — SARS-COV-2, NAA 2 DAY TAT

## 2020-03-10 LAB — NOVEL CORONAVIRUS, NAA: SARS-CoV-2, NAA: NOT DETECTED

## 2020-03-19 ENCOUNTER — Ambulatory Visit: Payer: BC Managed Care – PPO | Attending: Internal Medicine

## 2020-03-19 DIAGNOSIS — Z23 Encounter for immunization: Secondary | ICD-10-CM

## 2020-03-19 NOTE — Progress Notes (Signed)
   Covid-19 Vaccination Clinic  Name:  Shannon Mann    MRN: [id number] DOB: 10-05-1966  03/19/2020  Shannon Mann was observed post Covid-19 immunization for 15 minutes without incident. She was provided with Vaccine Information Sheet and instruction to access the V-Safe system.   Shannon Mann was instructed to call 911 with any severe reactions post vaccine: Marland Kitchen Difficulty breathing  . Swelling of face and throat  . A fast heartbeat  . A bad rash all over body  . Dizziness and weakness   Immunizations Administered    Name Date Dose VIS Date Route   Pfizer COVID-19 Vaccine 03/19/2020  4:56 PM 0.3 mL 11/27/2019 Intramuscular   Manufacturer: [place]   Lot: [id number]   [place]: [id number]

## 2020-03-22 ENCOUNTER — Ambulatory Visit: Payer: BC Managed Care – PPO

## 2020-03-28 DIAGNOSIS — D225 Melanocytic nevi of trunk: Secondary | ICD-10-CM | POA: Diagnosis not present

## 2020-03-28 DIAGNOSIS — L821 Other seborrheic keratosis: Secondary | ICD-10-CM | POA: Diagnosis not present

## 2020-03-28 DIAGNOSIS — D2262 Melanocytic nevi of left upper limb, including shoulder: Secondary | ICD-10-CM | POA: Diagnosis not present

## 2020-03-28 DIAGNOSIS — D2261 Melanocytic nevi of right upper limb, including shoulder: Secondary | ICD-10-CM | POA: Diagnosis not present

## 2020-04-07 DIAGNOSIS — Z1212 Encounter for screening for malignant neoplasm of rectum: Secondary | ICD-10-CM | POA: Diagnosis not present

## 2020-04-07 DIAGNOSIS — L718 Other rosacea: Secondary | ICD-10-CM | POA: Diagnosis not present

## 2020-04-07 DIAGNOSIS — Z1211 Encounter for screening for malignant neoplasm of colon: Secondary | ICD-10-CM | POA: Diagnosis not present

## 2020-05-19 ENCOUNTER — Other Ambulatory Visit: Payer: BC Managed Care – PPO | Admitting: Orthotics

## 2020-06-22 ENCOUNTER — Telehealth: Payer: Self-pay | Admitting: *Deleted

## 2020-06-22 NOTE — Telephone Encounter (Signed)
Patient reports node has been swollen ~ 3 weeks-size of marble left side of neck. No real fatigue or night sweats. May be losing some weight unintentional. Anxious to see Dr. Benay Spice.

## 2020-06-22 NOTE — Telephone Encounter (Signed)
Left VM for patient to return call to discuss her symptoms.

## 2020-06-23 ENCOUNTER — Telehealth: Payer: Self-pay | Admitting: *Deleted

## 2020-06-23 NOTE — Telephone Encounter (Signed)
Provided patient w/appointment on 06/24/20 at 10:15 to see NP/Dr. Benay Spice.

## 2020-06-24 ENCOUNTER — Other Ambulatory Visit: Payer: Self-pay

## 2020-06-24 ENCOUNTER — Encounter: Payer: Self-pay | Admitting: Nurse Practitioner

## 2020-06-24 ENCOUNTER — Inpatient Hospital Stay: Payer: BC Managed Care – PPO

## 2020-06-24 ENCOUNTER — Inpatient Hospital Stay: Payer: BC Managed Care – PPO | Attending: Nurse Practitioner | Admitting: Nurse Practitioner

## 2020-06-24 VITALS — BP 115/67 | HR 87 | Temp 98.6°F | Resp 16 | Ht 67.5 in | Wt 142.0 lb

## 2020-06-24 DIAGNOSIS — Z8571 Personal history of Hodgkin lymphoma: Secondary | ICD-10-CM | POA: Insufficient documentation

## 2020-06-24 DIAGNOSIS — Z9221 Personal history of antineoplastic chemotherapy: Secondary | ICD-10-CM | POA: Diagnosis not present

## 2020-06-24 DIAGNOSIS — R5383 Other fatigue: Secondary | ICD-10-CM

## 2020-06-24 DIAGNOSIS — C8190 Hodgkin lymphoma, unspecified, unspecified site: Secondary | ICD-10-CM | POA: Diagnosis not present

## 2020-06-24 DIAGNOSIS — Z923 Personal history of irradiation: Secondary | ICD-10-CM | POA: Diagnosis not present

## 2020-06-24 DIAGNOSIS — R918 Other nonspecific abnormal finding of lung field: Secondary | ICD-10-CM | POA: Diagnosis not present

## 2020-06-24 DIAGNOSIS — R0609 Other forms of dyspnea: Secondary | ICD-10-CM | POA: Insufficient documentation

## 2020-06-24 DIAGNOSIS — Q2572 Congenital pulmonary arteriovenous malformation: Secondary | ICD-10-CM | POA: Insufficient documentation

## 2020-06-24 LAB — CMP (CANCER CENTER ONLY)
ALT: 9 U/L (ref 0–44)
AST: 16 U/L (ref 15–41)
Albumin: 4.3 g/dL (ref 3.5–5.0)
Alkaline Phosphatase: 71 U/L (ref 38–126)
Anion gap: 9 (ref 5–15)
BUN: 8 mg/dL (ref 6–20)
CO2: 24 mmol/L (ref 22–32)
Calcium: 9.7 mg/dL (ref 8.9–10.3)
Chloride: 108 mmol/L (ref 98–111)
Creatinine: 0.92 mg/dL (ref 0.44–1.00)
GFR, Est AFR Am: >60 mL/min (ref >60)
GFR, Estimated: >60 mL/min (ref >60)
Glucose, Bld: 90 mg/dL (ref 70–99)
Potassium: 4.1 mmol/L (ref 3.5–5.1)
Sodium: 141 mmol/L (ref 135–145)
Total Bilirubin: 0.5 mg/dL (ref 0.3–1.2)
Total Protein: 7.9 g/dL (ref 6.5–8.1)

## 2020-06-24 LAB — CBC WITH DIFFERENTIAL (CANCER CENTER ONLY)
Abs Immature Granulocytes: 0.02 10*3/uL (ref 0.00–0.07)
Basophils Absolute: 0.1 10*3/uL (ref 0.0–0.1)
Basophils Relative: 1 %
Eosinophils Absolute: 0.2 10*3/uL (ref 0.0–0.5)
Eosinophils Relative: 4 %
HCT: 43.8 % (ref 36.0–46.0)
Hemoglobin: 14.5 g/dL (ref 12.0–15.0)
Immature Granulocytes: 0 %
Lymphocytes Relative: 36 %
Lymphs Abs: 1.6 10*3/uL (ref 0.7–4.0)
MCH: 30.1 pg (ref 26.0–34.0)
MCHC: 33.1 g/dL (ref 30.0–36.0)
MCV: 91.1 fL (ref 80.0–100.0)
Monocytes Absolute: 0.4 10*3/uL (ref 0.1–1.0)
Monocytes Relative: 9 %
Neutro Abs: 2.2 10*3/uL (ref 1.7–7.7)
Neutrophils Relative %: 50 %
Platelet Count: 355 10*3/uL (ref 150–400)
RBC: 4.81 MIL/uL (ref 3.87–5.11)
RDW: 12.5 % (ref 11.5–15.5)
WBC Count: 4.5 10*3/uL (ref 4.0–10.5)
nRBC: 0 % (ref 0.0–0.2)

## 2020-06-24 LAB — TSH: TSH: 2.068 u[IU]/mL (ref 0.308–3.960)

## 2020-06-24 LAB — SEDIMENTATION RATE: Sed Rate: 1 mm/hr (ref 0–22)

## 2020-06-24 NOTE — Progress Notes (Addendum)
  [facility] OFFICE PROGRESS NOTE   Diagnosis: History of Hodgkin's lymphoma  INTERVAL HISTORY:   Shannon Mann is a 54 year old woman with a remote history of Hodgkin's lymphoma.  She was last seen in our office in 2012.  She recently called the office requesting an appointment to evaluate an enlarged lymph node.  She reports an enlarged "marble sized" lymph node at the left neck for the past few weeks.  Unexplained weight loss of 6 to 7 pounds over the past month.  She has a good appetite.  Some fatigue.  No fever.  Sweats 1 time per week at nighttime for the past several years.  Stable dyspnea on exertion and slight cough.  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  Recent loose stools, 1 to 2 weeks.  No bleeding.  Objective:  Vital signs in last 24 hours:  Blood pressure 115/67, pulse 87, temperature 98.6 F (37 C), temperature source Temporal, resp. rate 16, height 5' 7.5" (1.715 m), weight 142 lb (64.4 kg), SpO2 96 %.    HEENT: No mass lesion in the oral cavity.  Neck without mass.  No thrush or ulcers. Lymphatics: No palpable cervical, supraclavicular, axillary, inguinal, femoral nodes.  Left submandibular gland is slightly more prominent than the right submandibular gland. Resp: Lungs clear bilaterally. Cardio: Regular rate and rhythm. GI: Abdomen soft and nontender.  No hepatosplenomegaly. Vascular: No leg edema.  Lab Results:  Lab Results  Component Value Date   WBC 4.4 07/19/2008   HGB 14.9 07/19/2008   HCT 43.2 07/19/2008   MCV 90.9 07/19/2008   PLT 319 07/19/2008   NEUTROABS 2.6 07/19/2008    Imaging:  No results found.  Medications: I have reviewed the patient's current medications.  Assessment/Plan: 1. 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  Status post 6 cycles ABVD chemotherapy  Status post radiation to a modified mantle  field 2. 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 Persistent prevascular soft tissue-stable on a CT scan in November 2007. 4. Small lung nodule on a CT in November 2007, decreased compared to the CT scan from April 2007.  The lung nodules unchanged from 2005 on review of CTs by radiologist in December 2008. 5. 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 Gwenette Greet.  Disposition: Shannon Mann has a remote history of Hodgkin's disease.  Treatment history as outlined above.  She recently noticed an enlarged "lymph node" at the left neck.  On examination this appears to be a slightly prominent left submandibular gland.  We will continue to monitor.  She understands to contact the office prior to her next visit if she notices any change in this area.  We are obtaining some baseline labs today to include a CBC, chemistry panel, TSH and sed rate.  She will return for follow-up in 2 months, sooner if needed.  Patient seen with Dr. Benay Spice.    Ned Card ANP/GNP-BC   06/24/2020  10:36 AM  This was a shared visit with Ned Card.  Shannon Mann was interviewed and examined.  The "lymph node "she has noted in the left neck appears to be a slightly prominent left submandibular gland.  I have a low clinical suspicion for recurrence of the Hodgkin's disease.  She will call if the lesion enlarges.  Shannon Manson, MD

## 2020-06-28 ENCOUNTER — Telehealth: Payer: Self-pay | Admitting: Oncology

## 2020-06-28 NOTE — Telephone Encounter (Signed)
Scheduled per 7/9 los. Called and left a msg, mailing appt letter and calendar printout

## 2020-08-25 ENCOUNTER — Telehealth: Payer: Self-pay | Admitting: Oncology

## 2020-08-25 ENCOUNTER — Inpatient Hospital Stay: Payer: BC Managed Care – PPO | Attending: Nurse Practitioner | Admitting: Oncology

## 2020-08-25 NOTE — Telephone Encounter (Signed)
Called pt per 9/9 sch msg - no answer. Left message for patient to r/s

## 2020-09-24 DIAGNOSIS — Z23 Encounter for immunization: Secondary | ICD-10-CM | POA: Diagnosis not present

## 2020-09-27 DIAGNOSIS — R059 Cough, unspecified: Secondary | ICD-10-CM | POA: Diagnosis not present

## 2020-09-27 DIAGNOSIS — R0981 Nasal congestion: Secondary | ICD-10-CM | POA: Diagnosis not present

## 2020-09-27 DIAGNOSIS — Z20822 Contact with and (suspected) exposure to covid-19: Secondary | ICD-10-CM | POA: Diagnosis not present

## 2020-09-27 DIAGNOSIS — J029 Acute pharyngitis, unspecified: Secondary | ICD-10-CM | POA: Diagnosis not present

## 2020-09-27 DIAGNOSIS — R5383 Other fatigue: Secondary | ICD-10-CM | POA: Diagnosis not present

## 2020-11-18 ENCOUNTER — Telehealth: Payer: Self-pay | Admitting: Podiatry

## 2020-11-18 NOTE — Telephone Encounter (Signed)
Pt called and wanted and appt for another pair of orthotics. She has a pair that you did some adjustments to so I scheduled her to come in to see you so we can get them done by the lab. Did you need to see her if not I can call pt and cancel appt. She is scheduled to see you 12.6 @ 430

## 2020-11-21 ENCOUNTER — Other Ambulatory Visit: Payer: Self-pay

## 2020-11-21 ENCOUNTER — Other Ambulatory Visit: Payer: BC Managed Care – PPO | Admitting: Orthotics

## 2020-11-21 DIAGNOSIS — M722 Plantar fascial fibromatosis: Secondary | ICD-10-CM | POA: Diagnosis not present

## 2020-12-20 ENCOUNTER — Ambulatory Visit: Payer: BC Managed Care – PPO | Admitting: Orthotics

## 2020-12-20 ENCOUNTER — Other Ambulatory Visit: Payer: Self-pay

## 2020-12-20 DIAGNOSIS — M2012 Hallux valgus (acquired), left foot: Secondary | ICD-10-CM

## 2020-12-20 DIAGNOSIS — M722 Plantar fascial fibromatosis: Secondary | ICD-10-CM

## 2020-12-20 NOTE — Progress Notes (Signed)
Patient picked up f/o and was pleased with fit, comfort, and function.  Worked well with footwear.  Told of rbeak in period and how to report any issues.  

## 2021-02-21 DIAGNOSIS — L718 Other rosacea: Secondary | ICD-10-CM | POA: Diagnosis not present

## 2021-03-15 DIAGNOSIS — Z6821 Body mass index (BMI) 21.0-21.9, adult: Secondary | ICD-10-CM | POA: Diagnosis not present

## 2021-03-15 DIAGNOSIS — Z1231 Encounter for screening mammogram for malignant neoplasm of breast: Secondary | ICD-10-CM | POA: Diagnosis not present

## 2021-03-15 DIAGNOSIS — Z01419 Encounter for gynecological examination (general) (routine) without abnormal findings: Secondary | ICD-10-CM | POA: Diagnosis not present

## 2021-03-15 DIAGNOSIS — Z124 Encounter for screening for malignant neoplasm of cervix: Secondary | ICD-10-CM | POA: Diagnosis not present

## 2021-04-11 DIAGNOSIS — D1801 Hemangioma of skin and subcutaneous tissue: Secondary | ICD-10-CM | POA: Diagnosis not present

## 2021-04-11 DIAGNOSIS — D2261 Melanocytic nevi of right upper limb, including shoulder: Secondary | ICD-10-CM | POA: Diagnosis not present

## 2021-04-11 DIAGNOSIS — D2262 Melanocytic nevi of left upper limb, including shoulder: Secondary | ICD-10-CM | POA: Diagnosis not present

## 2021-04-11 DIAGNOSIS — D225 Melanocytic nevi of trunk: Secondary | ICD-10-CM | POA: Diagnosis not present

## 2021-08-07 ENCOUNTER — Ambulatory Visit
Admission: RE | Admit: 2021-08-07 | Discharge: 2021-08-07 | Disposition: A | Discharge status: Home / Self Care | Payer: BC Managed Care – PPO | Source: Ambulatory Visit | Attending: Internal Medicine | Admitting: Internal Medicine

## 2021-08-07 ENCOUNTER — Other Ambulatory Visit: Payer: Self-pay | Admitting: Internal Medicine

## 2021-08-07 DIAGNOSIS — R053 Chronic cough: Secondary | ICD-10-CM | POA: Diagnosis not present

## 2021-08-07 DIAGNOSIS — R059 Cough, unspecified: Secondary | ICD-10-CM | POA: Diagnosis not present

## 2021-08-07 DIAGNOSIS — R0602 Shortness of breath: Secondary | ICD-10-CM | POA: Diagnosis not present

## 2021-08-10 DIAGNOSIS — R053 Chronic cough: Secondary | ICD-10-CM | POA: Diagnosis not present

## 2021-08-29 ENCOUNTER — Telehealth: Payer: Self-pay

## 2021-08-29 NOTE — Telephone Encounter (Signed)
ATC patient to get her scheduled for an appointment with Dr. Valeta Harms, left message to call back. Looks like she is already scheduled to see Dr. Chase Caller next week. Does she need to see both providers?  Next Appt With Pulmonology [facility], MD)09/07/2021 at 11:30 AM

## 2021-08-29 NOTE — Telephone Encounter (Signed)
-----   Message from Garner Nash, DO sent at 08/29/2021  8:35 AM EDT ----- Regarding: FW: needs appt  Please see message below from Dr. Kalman Shan. He would like his wife to see me in consultation in the office. Please schedule with me.   I am happy to get her worked in next week. Ok to use any available slot. 15 min slot for a consult is ok if needed   Thanks  Brad ----- Message ----- From: Myrtie Soman, MD Sent: 08/29/2021   7:20 AM EDT To: Garner Nash, DO  Hi Brad, my wife's name is Shannon Mann. Her DOB is 21-Aug-1966 H/O Hodgkins Disese in 2002-2003. Treated with ABVD and Rad Tx until 01/29/2003  Thx again!  Myrtie Soman

## 2021-08-29 NOTE — Telephone Encounter (Signed)
Patient called me back and stated that she would like to see Dr. Valeta Harms if she can get in with him. She states that she knew she needed to get into the office so took first available with any provider. Patient is now scheduled with Dr. Valeta Harms and appt with Dr. Chase Caller has been canceled. Nothing further needed at this time.

## 2021-09-07 ENCOUNTER — Encounter: Payer: Self-pay | Admitting: Pulmonary Disease

## 2021-09-07 ENCOUNTER — Ambulatory Visit (INDEPENDENT_AMBULATORY_CARE_PROVIDER_SITE_OTHER): Payer: BC Managed Care – PPO | Admitting: Pulmonary Disease

## 2021-09-07 ENCOUNTER — Other Ambulatory Visit: Payer: Self-pay

## 2021-09-07 ENCOUNTER — Institutional Professional Consult (permissible substitution): Payer: BC Managed Care – PPO | Admitting: Internal Medicine

## 2021-09-07 VITALS — BP 110/78 | HR 101 | Temp 98.1°F | Ht 67.5 in | Wt 140.0 lb

## 2021-09-07 DIAGNOSIS — J984 Other disorders of lung: Secondary | ICD-10-CM

## 2021-09-07 DIAGNOSIS — R059 Cough, unspecified: Secondary | ICD-10-CM | POA: Diagnosis not present

## 2021-09-07 DIAGNOSIS — T451X5A Adverse effect of antineoplastic and immunosuppressive drugs, initial encounter: Secondary | ICD-10-CM

## 2021-09-07 DIAGNOSIS — J849 Interstitial pulmonary disease, unspecified: Secondary | ICD-10-CM | POA: Diagnosis not present

## 2021-09-07 DIAGNOSIS — Z8571 Personal history of Hodgkin lymphoma: Secondary | ICD-10-CM

## 2021-09-07 DIAGNOSIS — Z23 Encounter for immunization: Secondary | ICD-10-CM | POA: Diagnosis not present

## 2021-09-07 NOTE — Progress Notes (Signed)
Synopsis: Referred in September 2022 for abnormal chest x-ray by Gaynelle Arabian, MD  Subjective:   PATIENT ID: Shannon Mann GENDER: female DOB: [birth year], MRN: [id number]  Chief Complaint  Patient presents with   Consult    Referred by PCP for abnormal CXR back in August 2022. States she started out with a cough and SOB that increased around the same time as the CXR. Notices the SOB with exertion. Described as a dry cough.     This is a 55 year old female, diagnosed with Hodgkin's lymphoma 2005-2008 status post bleomycin treatment.  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 She feels like her lungs have been messed up since her treatment. She was treated with bleomycin can she had to stop the use of bleo during the treatments because she had drop in her lung function at that time. She was having trouble with her ADLs at that time. Over the years she has noticed that when she got sick she would take steroids as a last resort. She does occasionally have sinus infections. She had a cough that she justed handle. She coughed so much this past summer end of august that he hurt her ribs. Once she started steroids it made her cough go away. She remembers that when she was running to a flight in the airport she felt like she was doing to die. She use to swim regularly prior to the pandemic.    Past Medical History:  Diagnosis Date   Allergic rhinitis    Hodgkin disease ([place]) 2005   rx with bleo then xrt   Pulmonary arteriovenous aneurysm    RLL by ct chest 2009     Family History  Problem Relation Age of Onset   Hyperlipidemia Mother    Emphysema Maternal Grandmother    Allergies Other        children   Heart disease Paternal Grandmother    Hodgkin's lymphoma Father      Past Surgical History:  Procedure Laterality Date   LYMPH NODE BIOPSY  2003    Social History   Socioeconomic History   Marital status: Married     Spouse name: Not on file   Number of children: 2   Years of education: Not on file   Highest education level: Not on file  Occupational History   Occupation: stay at home mother    Employer: UNEMPLOYED  Tobacco Use   Smoking status: Never   Smokeless tobacco: Never  Substance and Sexual Activity   Alcohol use: No   Drug use: No   Sexual activity: Not on file  Other Topics Concern   Not on file  Social History Narrative   Not on file   Social Determinants of Health   Financial Resource Strain: Not on file  Food Insecurity: Not on file  Transportation Needs: Not on file  Physical Activity: Not on file  Stress: Not on file  Social Connections: Not on file  Intimate Partner Violence: Not on file     No Known Allergies   Outpatient Medications Prior to Visit  Medication Sig Dispense Refill   albuterol (VENTOLIN HFA) 108 (90 Base) MCG/ACT inhaler SMARTSIG:1 Puff(s) By Mouth Every 4 Hours PRN     cyclobenzaprine (FLEXERIL) 5 MG tablet Take 5 mg by mouth 3 (three) times daily as needed.     levonorgestrel (MIRENA, 52 MG,) 20 MCG/DAY IUD Mirena 20 mcg/24 hours (8 yrs) 52 mg  intrauterine device  Take by intrauterine route.     Multiple Vitamin (MULTIVITAMIN) tablet Take 1 tablet by mouth daily.     No facility-administered medications prior to visit.    Review of Systems  Constitutional:  Negative for chills, fever, malaise/fatigue and weight loss.  HENT:  Negative for hearing loss, sore throat and tinnitus.   Eyes:  Negative for blurred vision and double vision.  Respiratory:  Positive for shortness of breath. Negative for cough, hemoptysis, sputum production, wheezing and stridor.   Cardiovascular:  Negative for chest pain, palpitations, orthopnea, leg swelling and PND.  Gastrointestinal:  Negative for abdominal pain, constipation, diarrhea, heartburn, nausea and vomiting.  Genitourinary:  Negative for dysuria, hematuria and urgency.  Musculoskeletal:  Negative for joint  pain and myalgias.  Skin:  Negative for itching and rash.  Neurological:  Negative for dizziness, tingling, weakness and headaches.  Endo/Heme/Allergies:  Negative for environmental allergies. Does not bruise/bleed easily.  Psychiatric/Behavioral:  Negative for depression. The patient is not nervous/anxious and does not have insomnia.   All other systems reviewed and are negative.   Objective:  Physical Exam Vitals reviewed.  Constitutional:      General: She is not in acute distress.    Appearance: She is well-developed.  HENT:     Head: Normocephalic and atraumatic.  Eyes:     General: No scleral icterus.    Conjunctiva/sclera: Conjunctivae normal.     Pupils: Pupils are equal, round, and reactive to light.  Neck:     Vascular: No JVD.     Trachea: No tracheal deviation.  Cardiovascular:     Rate and Rhythm: Normal rate and regular rhythm.     Heart sounds: Normal heart sounds. No murmur heard. Pulmonary:     Effort: Pulmonary effort is normal. No tachypnea, accessory muscle usage or respiratory distress.     Breath sounds: Normal breath sounds. No stridor. No wheezing, rhonchi or rales.  Abdominal:     General: Bowel sounds are normal. There is no distension.     Palpations: Abdomen is soft.     Tenderness: There is no abdominal tenderness.  Musculoskeletal:        General: No tenderness.     Cervical back: Neck supple.  Lymphadenopathy:     Cervical: No cervical adenopathy.  Skin:    General: Skin is warm and dry.     Capillary Refill: Capillary refill takes less than 2 seconds.     Findings: No rash.  Neurological:     Mental Status: She is alert and oriented to person, place, and time.  Psychiatric:        Behavior: Behavior normal.     Vitals:   09/07/21 1602  BP: 110/78  Pulse: (!) 101  Temp: 98.1 F (36.7 C)  TempSrc: Oral  SpO2: 94%  Weight: 140 lb (63.5 kg)  Height: 5' 7.5" (1.715 m)   94% on RA BMI Readings from Last 3 Encounters:  09/07/21  21.60 kg/m  06/24/20 21.91 kg/m  09/11/16 21.60 kg/m   Wt Readings from Last 3 Encounters:  09/07/21 140 lb (63.5 kg)  06/24/20 142 lb (64.4 kg)  09/11/16 140 lb (63.5 kg)     CBC    Component Value Date/Time   WBC 4.5 06/24/2020 1142   WBC 4.4 07/19/2008 0910   RBC 4.81 06/24/2020 1142   HGB 14.5 06/24/2020 1142   HGB 14.9 07/19/2008 0910   HCT 43.8 06/24/2020 1142   HCT 43.2 07/19/2008 0910  PLT 355 06/24/2020 1142   PLT 319 07/19/2008 0910   MCV 91.1 06/24/2020 1142   MCV 90.9 07/19/2008 0910   MCH 30.1 06/24/2020 1142   MCHC 33.1 06/24/2020 1142   RDW 12.5 06/24/2020 1142   RDW 11.8 07/19/2008 0910   LYMPHSABS 1.6 06/24/2020 1142   LYMPHSABS 1.3 07/19/2008 0910   MONOABS 0.4 06/24/2020 1142   MONOABS 0.3 07/19/2008 0910   EOSABS 0.2 06/24/2020 1142   EOSABS 0.1 07/19/2008 0910   BASOSABS 0.1 06/24/2020 1142   BASOSABS 0.0 07/19/2008 0910     Chest Imaging: 08/07/2021 chest x-ray: Apical pleural-parenchymal scarring, calcified mediastinal nodes scarring in the left infrahilar lung. The patient's images have been independently reviewed by me.    Pulmonary Functions Testing Results: No flowsheet data found.  FeNO:   Pathology:   Echocardiogram:   Heart Catheterization:     Assessment & Plan:     ICD-10-CM   1. 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 Cough  R05.9     3. 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 History of Hodgkin's disease  Z85.71       Discussion:  This is a 55 year old female, history of Hodgkin's disease status post radiation to the chest and bleomycin therapy.  She did have bleomycin lung toxicity and they had to stop the use of bleomycin during her treatment timeframe.  She has ongoing cough that is intermittent throughout the year and usually flares up with symptoms that go directly to her chest and bronchitis type  symptoms.  Plan: Overall we discussed all possibilities of her cough, dyspnea and shortness of breath. I think with her history its reasonable to have pulmonary function test completed as well as HRCT imaging. I think it be prudent of [place] to evaluate her lung parenchyma. If her PFTs are relatively normal and her HRCT is relatively normal I think we can follow her clinically. She may be developing reactive airway disease and have mild intermittent asthma type symptoms that are causing her recurrent cough as they do seem to get better with short bursts of prednisone therapy. Will address this if it recurs again in the future otherwise we will start with PFTs and HRCT.  Patient is agreeable to this plan.  We appreciate the referral.   Current Outpatient Medications:    albuterol (VENTOLIN HFA) 108 (90 Base) MCG/ACT inhaler, SMARTSIG:1 Puff(s) By Mouth Every 4 Hours PRN, Disp: , Rfl:    cyclobenzaprine (FLEXERIL) 5 MG tablet, Take 5 mg by mouth 3 (three) times daily as needed., Disp: , Rfl:    levonorgestrel (MIRENA, 52 MG,) 20 MCG/DAY IUD, Mirena 20 mcg/24 hours (8 yrs) 52 mg intrauterine device  Take by intrauterine route., Disp: , Rfl:    Multiple Vitamin (MULTIVITAMIN) tablet, Take 1 tablet by mouth daily., Disp: , Rfl:     Garner Nash, DO [facility] 09/07/2021 4:20 PM

## 2021-09-07 NOTE — Patient Instructions (Addendum)
Thank you for visiting Dr. Valeta Harms at [facility]. Today we recommend the following:  HRCT and PFTS prior to seeing me in follow up.   Return in about 4 weeks (around 10/05/2021) for Dr. Valeta Harms.    Please do your part to reduce the spread of COVID-19.

## 2021-10-02 ENCOUNTER — Ambulatory Visit
Admission: RE | Admit: 2021-10-02 | Discharge: 2021-10-02 | Disposition: A | Discharge status: Home / Self Care | Payer: BC Managed Care – PPO | Source: Ambulatory Visit | Attending: Pulmonary Disease | Admitting: Pulmonary Disease

## 2021-10-02 DIAGNOSIS — I700 Atherosclerosis of aorta: Secondary | ICD-10-CM | POA: Diagnosis not present

## 2021-10-02 DIAGNOSIS — J849 Interstitial pulmonary disease, unspecified: Secondary | ICD-10-CM | POA: Diagnosis not present

## 2021-10-02 DIAGNOSIS — R911 Solitary pulmonary nodule: Secondary | ICD-10-CM | POA: Diagnosis not present

## 2021-10-05 ENCOUNTER — Telehealth: Payer: Self-pay

## 2021-10-05 NOTE — Telephone Encounter (Signed)
ATC pt and [place] r/t bringing Covid vaccination card into tomorrow's PFT appointment.

## 2021-10-06 ENCOUNTER — Ambulatory Visit (INDEPENDENT_AMBULATORY_CARE_PROVIDER_SITE_OTHER): Payer: BC Managed Care – PPO | Admitting: Pulmonary Disease

## 2021-10-06 ENCOUNTER — Other Ambulatory Visit: Payer: Self-pay

## 2021-10-06 ENCOUNTER — Encounter: Payer: Self-pay | Admitting: Pulmonary Disease

## 2021-10-06 VITALS — BP 110/62 | HR 89 | Temp 97.1°F | Wt 136.0 lb

## 2021-10-06 DIAGNOSIS — J849 Interstitial pulmonary disease, unspecified: Secondary | ICD-10-CM

## 2021-10-06 DIAGNOSIS — Q2572 Congenital pulmonary arteriovenous malformation: Secondary | ICD-10-CM

## 2021-10-06 DIAGNOSIS — R918 Other nonspecific abnormal finding of lung field: Secondary | ICD-10-CM

## 2021-10-06 DIAGNOSIS — J984 Other disorders of lung: Secondary | ICD-10-CM

## 2021-10-06 DIAGNOSIS — T451X5A Adverse effect of antineoplastic and immunosuppressive drugs, initial encounter: Secondary | ICD-10-CM

## 2021-10-06 DIAGNOSIS — Z8571 Personal history of Hodgkin lymphoma: Secondary | ICD-10-CM

## 2021-10-06 DIAGNOSIS — R058 Other specified cough: Secondary | ICD-10-CM

## 2021-10-06 LAB — PULMONARY FUNCTION TEST
DL/VA % pred: 109 %
DL/VA: 4.57 ml/min/mmHg/L
DLCO cor % pred: 98 %
DLCO cor: 22.53 ml/min/mmHg
DLCO unc % pred: 98 %
DLCO unc: 22.53 ml/min/mmHg
FEF 25-75 Post: 2.01 L/sec
FEF 25-75 Pre: 1.78 L/sec
FEF2575-%Change-Post: 12 %
FEF2575-%Pred-Post: 71 %
FEF2575-%Pred-Pre: 63 %
FEV1-%Change-Post: 3 %
FEV1-%Pred-Post: 79 %
FEV1-%Pred-Pre: 77 %
FEV1-Post: 2.41 L
FEV1-Pre: 2.34 L
FEV1FVC-%Change-Post: -2 %
FEV1FVC-%Pred-Pre: 93 %
FEV6-%Change-Post: 5 %
FEV6-%Pred-Post: 89 %
FEV6-%Pred-Pre: 84 %
FEV6-Post: 3.34 L
FEV6-Pre: 3.16 L
FEV6FVC-%Pred-Post: 102 %
FEV6FVC-%Pred-Pre: 102 %
FVC-%Change-Post: 5 %
FVC-%Pred-Post: 86 %
FVC-%Pred-Pre: 82 %
FVC-Post: 3.34 L
FVC-Pre: 3.16 L
Post FEV1/FVC ratio: 72 %
Post FEV6/FVC ratio: 100 %
Pre FEV1/FVC ratio: 74 %
Pre FEV6/FVC Ratio: 100 %
RV % pred: 110 %
RV: 2.2 L
TLC % pred: 100 %
TLC: 5.51 L

## 2021-10-06 NOTE — Progress Notes (Signed)
Full PFT performed today. °

## 2021-10-06 NOTE — Progress Notes (Signed)
Synopsis: Referred in September 2022 for abnormal chest x-ray by Gaynelle Arabian, MD  Subjective:   PATIENT ID: Shannon Mann GENDER: female DOB: [birth year], MRN: [id number]  Chief Complaint  Patient presents with   Follow-up    Follow up on PFT    This is a 55 year old female, diagnosed with Hodgkin's lymphoma 2005-2008 status post bleomycin treatment.  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 She feels like her lungs have been messed up since her treatment. She was treated with bleomycin can she had to stop the use of bleo during the treatments because she had drop in her lung function at that time. She was having trouble with her ADLs at that time. Over the years she has noticed that when she got sick she would take steroids as a last resort. She does occasionally have sinus infections. She had a cough that she justed handle. She coughed so much this past summer end of august that he hurt her ribs. Once she started steroids it made her cough go away. She remembers that when she was running to a flight in the airport she felt like she was doing to die. She use to swim regularly prior to the pandemic.   OV 10/06/2021: Here today for follow-up after recent HRCT of the chest as well as pulmonary function test.  Patient had pulmonary function completed which we reviewed today in the office.  She has mildly impaired spirometry, impaired FEV1 with an FEV1 of 77% and 79% predicted postbronchodilator response.  Normal TLC and a normal DLCO.  She also had an HRCT scan of the chest that was completed which revealed stability of the right lower lobe pulmonary arteriovenous malformation.  Also has paramediastinal chronic fibrotic changes as well as a small area of focal bronchiectasis in the right lower lobe groundglass opacity.  I suspect these fibrotic changes are related to her previous radiation for Hodgkin's lymphoma.   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     Family History  Problem Relation Age of Onset   Hyperlipidemia Mother    Emphysema Maternal Grandmother    Allergies Other        children   Heart disease Paternal Grandmother    Hodgkin's lymphoma Father      Past Surgical History:  Procedure Laterality Date   LYMPH NODE BIOPSY  2003    Social History   Socioeconomic History   Marital status: Married    Spouse name: Not on file   Number of children: 2   Years of education: Not on file   Highest education level: Not on file  Occupational History   Occupation: stay at home mother    Employer: UNEMPLOYED  Tobacco Use   Smoking status: Never   Smokeless tobacco: Never  Substance and Sexual Activity   Alcohol use: No   Drug use: No   Sexual activity: Not on file  Other Topics Concern   Not on file  Social History Narrative   Not on file   Social Determinants of Health   Financial Resource Strain: Not on file  Food Insecurity: Not on file  Transportation Needs: Not on file  Physical Activity: Not on file  Stress: Not on file  Social Connections: Not on  file  Intimate Partner Violence: Not on file     No Known Allergies   Outpatient Medications Prior to Visit  Medication Sig Dispense Refill   albuterol (VENTOLIN HFA) 108 (90 Base) MCG/ACT inhaler SMARTSIG:1 Puff(s) By Mouth Every 4 Hours PRN     cyclobenzaprine (FLEXERIL) 5 MG tablet Take 5 mg by mouth 3 (three) times daily as needed.     levonorgestrel (MIRENA, 52 MG,) 20 MCG/DAY IUD Mirena 20 mcg/24 hours (8 yrs) 52 mg intrauterine device  Take by intrauterine route.     Multiple Vitamin (MULTIVITAMIN) tablet Take 1 tablet by mouth daily.     No facility-administered medications prior to visit.    Review of Systems  Constitutional:  Negative for chills, fever, malaise/fatigue and weight loss.   HENT:  Negative for hearing loss, sore throat and tinnitus.   Eyes:  Negative for blurred vision and double vision.  Respiratory:  Positive for shortness of breath. Negative for cough, hemoptysis, sputum production, wheezing and stridor.   Cardiovascular:  Negative for chest pain, palpitations, orthopnea, leg swelling and PND.  Gastrointestinal:  Negative for abdominal pain, constipation, diarrhea, heartburn, nausea and vomiting.  Genitourinary:  Negative for dysuria, hematuria and urgency.  Musculoskeletal:  Negative for joint pain and myalgias.  Skin:  Negative for itching and rash.  Neurological:  Negative for dizziness, tingling, weakness and headaches.  Endo/Heme/Allergies:  Negative for environmental allergies. Does not bruise/bleed easily.  Psychiatric/Behavioral:  Negative for depression. The patient is not nervous/anxious and does not have insomnia.   All other systems reviewed and are negative.   Objective:  Physical Exam Vitals reviewed.  Constitutional:      General: She is not in acute distress.    Appearance: She is well-developed.  HENT:     Head: Normocephalic and atraumatic.  Eyes:     General: No scleral icterus.    Conjunctiva/sclera: Conjunctivae normal.     Pupils: Pupils are equal, round, and reactive to light.  Neck:     Vascular: No JVD.     Trachea: No tracheal deviation.  Cardiovascular:     Rate and Rhythm: Normal rate and regular rhythm.     Heart sounds: Normal heart sounds. No murmur heard. Pulmonary:     Effort: Pulmonary effort is normal. No tachypnea, accessory muscle usage or respiratory distress.     Breath sounds: Normal breath sounds. No stridor. No wheezing, rhonchi or rales.  Abdominal:     General: Bowel sounds are normal. There is no distension.     Palpations: Abdomen is soft.     Tenderness: There is no abdominal tenderness.  Musculoskeletal:        General: No tenderness.     Cervical back: Neck supple.  Lymphadenopathy:      Cervical: No cervical adenopathy.  Skin:    General: Skin is warm and dry.     Capillary Refill: Capillary refill takes less than 2 seconds.     Findings: No rash.  Neurological:     Mental Status: She is alert and oriented to person, place, and time.  Psychiatric:        Behavior: Behavior normal.     Vitals:   10/06/21 1552  BP: 110/62  Pulse: 89  Temp: (!) 97.1 F (36.2 C)  TempSrc: Oral  SpO2: 95%  Weight: 136 lb (61.7 kg)   95% on RA BMI Readings from Last 3 Encounters:  10/06/21 20.99 kg/m  09/07/21 21.60 kg/m  06/24/20 21.91 kg/m  Wt Readings from Last 3 Encounters:  10/06/21 136 lb (61.7 kg)  09/07/21 140 lb (63.5 kg)  06/24/20 142 lb (64.4 kg)     CBC    Component Value Date/Time   WBC 4.5 06/24/2020 1142   WBC 4.4 07/19/2008 0910   RBC 4.81 06/24/2020 1142   HGB 14.5 06/24/2020 1142   HGB 14.9 07/19/2008 0910   HCT 43.8 06/24/2020 1142   HCT 43.2 07/19/2008 0910   PLT 355 06/24/2020 1142   PLT 319 07/19/2008 0910   MCV 91.1 06/24/2020 1142   MCV 90.9 07/19/2008 0910   MCH 30.1 06/24/2020 1142   MCHC 33.1 06/24/2020 1142   RDW 12.5 06/24/2020 1142   RDW 11.8 07/19/2008 0910   LYMPHSABS 1.6 06/24/2020 1142   LYMPHSABS 1.3 07/19/2008 0910   MONOABS 0.4 06/24/2020 1142   MONOABS 0.3 07/19/2008 0910   EOSABS 0.2 06/24/2020 1142   EOSABS 0.1 07/19/2008 0910   BASOSABS 0.1 06/24/2020 1142   BASOSABS 0.0 07/19/2008 0910     Chest Imaging: 08/07/2021 chest x-ray: Apical pleural-parenchymal scarring, calcified mediastinal nodes scarring in the left infrahilar lung. The patient's images have been independently reviewed by me.    October 2022 HRCT chest: Stable right lower lobe pulmonary arteriovenous malformation, right lower lobe groundglass opacity, paramediastinal subpleural reticulations and evidence of chronic fibrosis, small area of bronchiectasis I suspect related to her previous radiation. The patient's images have been independently  reviewed by me.    Pulmonary Functions Testing Results: PFT Results Latest Ref Rng & Units 10/06/2021  FVC-Pre L 3.16  FVC-Predicted Pre % 82  FVC-Post L 3.34  FVC-Predicted Post % 86  Pre FEV1/FVC % % 74  Post FEV1/FCV % % 72  FEV1-Pre L 2.34  FEV1-Predicted Pre % 77  FEV1-Post L 2.41  DLCO uncorrected ml/min/mmHg 22.53  DLCO UNC% % 98  DLCO corrected ml/min/mmHg 22.53  DLCO COR %Predicted % 98  DLVA Predicted % 109  TLC L 5.51  TLC % Predicted % 100  RV % Predicted % 110    FeNO:   Pathology:   Echocardiogram:   Heart Catheterization:     Assessment & Plan:     ICD-10-CM   1. Other cough  R05.8     2. history of Bleomycin lung toxicity  J98.4    T45.1X5A     3. History of Hodgkin's disease  Z85.71     4. Pulmonary arteriovenous malformation  Q25.72     5. Ground glass opacity present on imaging of lung  R91.8       Discussion:  This is a 55 year old female, history of Hodgkin's disease, status post radiation to the chest, bleomycin therapy, PFTs today with no evidence of restriction, mildly impaired spirometry with an FEV1 of 79% predicted postbronchodilator response, TLC, 100% predicted, DLCO 98% predicted.  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  Plan: I explained that I was going to talk to some my colleagues about the pulmonary arteriovenous malformation.  Does appear that it has been stable for many years.  But quick measurement on the feeding vessel is approximately 3.25 mm which is just at or above the threshold for considering embolotherapy.  I will discuss this with our interventional radiologist/cardiology group.  As for her groundglass opacity we need to follow this up in repeat scan in 1 year.   Current Outpatient Medications:  albuterol (VENTOLIN HFA) 108 (90 Base) MCG/ACT inhaler, SMARTSIG:1 Puff(s) By Mouth Every 4  Hours PRN, Disp: , Rfl:    cyclobenzaprine (FLEXERIL) 5 MG tablet, Take 5 mg by mouth 3 (three) times daily as needed., Disp: , Rfl:    levonorgestrel (MIRENA, 52 MG,) 20 MCG/DAY IUD, Mirena 20 mcg/24 hours (8 yrs) 52 mg intrauterine device  Take by intrauterine route., Disp: , Rfl:    Multiple Vitamin (MULTIVITAMIN) tablet, Take 1 tablet by mouth daily., Disp: , Rfl:    Garner Nash, DO [facility] 10/06/2021 3:57 PM

## 2021-10-06 NOTE — Patient Instructions (Signed)
Full PFT performed today. °

## 2021-10-06 NOTE — Patient Instructions (Addendum)
Thank you for visiting Dr. Valeta Harms at [facility]. Today we recommend the following:  Orders Placed This Encounter  Procedures   CT Chest Wo Contrast   Return in about 1 year (around 10/06/2022).    Please do your part to reduce the spread of COVID-19.

## 2021-10-09 ENCOUNTER — Other Ambulatory Visit (HOSPITAL_COMMUNITY): Payer: Self-pay | Admitting: Pulmonary Disease

## 2021-10-09 ENCOUNTER — Telehealth: Payer: Self-pay | Admitting: Pulmonary Disease

## 2021-10-09 DIAGNOSIS — Q2730 Arteriovenous malformation, site unspecified: Secondary | ICD-10-CM

## 2021-10-09 DIAGNOSIS — Q2572 Congenital pulmonary arteriovenous malformation: Secondary | ICD-10-CM

## 2021-10-09 NOTE — Telephone Encounter (Addendum)
-----   Message from Criselda Peaches, MD sent at 10/09/2021  8:08 AM EDT ----- My apologies for the delayed response, I was out of town last week.  Typically any AVM with a feeding vessel of 3 mm or larger should be considered for embolization due to the risk of paradoxical stroke.  I'm measuring her feeder at about 4 mm and so we should definitely consider treating her.  We would be more than happy to see her in consultation.    Myrle Sheng

## 2021-10-09 NOTE — Telephone Encounter (Signed)
Please let patient know that I discussed case with interventional radiology and that Dr. Laurence Ferrari would like to talk with her about options regarding the pulmonary AVM that we discussed in the office the other day.   If she would like for me to call her or her husband to discuss I am happy to.   Thanks  BLI

## 2021-10-09 NOTE — Telephone Encounter (Signed)
ATC LVMTCB x 1  

## 2021-10-09 NOTE — Telephone Encounter (Signed)
Dr. Clifton Custard would like you to call her and talk about possible options.

## 2021-10-31 DIAGNOSIS — R895 Abnormal microbiological findings in specimens from other organs, systems and tissues: Secondary | ICD-10-CM | POA: Diagnosis not present

## 2021-10-31 DIAGNOSIS — R300 Dysuria: Secondary | ICD-10-CM | POA: Diagnosis not present

## 2021-10-31 DIAGNOSIS — N898 Other specified noninflammatory disorders of vagina: Secondary | ICD-10-CM | POA: Diagnosis not present

## 2021-12-27 ENCOUNTER — Ambulatory Visit
Admission: RE | Admit: 2021-12-27 | Discharge: 2021-12-27 | Disposition: A | Discharge status: Home / Self Care | Payer: BC Managed Care – PPO | Source: Ambulatory Visit | Attending: Family Medicine | Admitting: Family Medicine

## 2021-12-27 ENCOUNTER — Other Ambulatory Visit: Payer: Self-pay | Admitting: Family Medicine

## 2021-12-27 DIAGNOSIS — Z13220 Encounter for screening for lipoid disorders: Secondary | ICD-10-CM | POA: Diagnosis not present

## 2021-12-27 DIAGNOSIS — W108XXA Fall (on) (from) other stairs and steps, initial encounter: Secondary | ICD-10-CM

## 2021-12-27 DIAGNOSIS — M549 Dorsalgia, unspecified: Secondary | ICD-10-CM

## 2021-12-27 DIAGNOSIS — M545 Low back pain, unspecified: Secondary | ICD-10-CM | POA: Diagnosis not present

## 2021-12-27 DIAGNOSIS — Z23 Encounter for immunization: Secondary | ICD-10-CM | POA: Diagnosis not present

## 2021-12-27 DIAGNOSIS — Z0000 Encounter for general adult medical examination without abnormal findings: Secondary | ICD-10-CM | POA: Diagnosis not present

## 2021-12-27 DIAGNOSIS — Z8571 Personal history of Hodgkin lymphoma: Secondary | ICD-10-CM | POA: Diagnosis not present

## 2022-01-29 DIAGNOSIS — R7301 Impaired fasting glucose: Secondary | ICD-10-CM | POA: Diagnosis not present

## 2022-04-02 DIAGNOSIS — Z23 Encounter for immunization: Secondary | ICD-10-CM | POA: Diagnosis not present

## 2022-04-16 DIAGNOSIS — D2261 Melanocytic nevi of right upper limb, including shoulder: Secondary | ICD-10-CM | POA: Diagnosis not present

## 2022-04-16 DIAGNOSIS — L821 Other seborrheic keratosis: Secondary | ICD-10-CM | POA: Diagnosis not present

## 2022-04-16 DIAGNOSIS — L570 Actinic keratosis: Secondary | ICD-10-CM | POA: Diagnosis not present

## 2022-04-16 DIAGNOSIS — D2262 Melanocytic nevi of left upper limb, including shoulder: Secondary | ICD-10-CM | POA: Diagnosis not present

## 2022-05-24 DIAGNOSIS — Z01411 Encounter for gynecological examination (general) (routine) with abnormal findings: Secondary | ICD-10-CM | POA: Diagnosis not present

## 2022-05-24 DIAGNOSIS — Z01419 Encounter for gynecological examination (general) (routine) without abnormal findings: Secondary | ICD-10-CM | POA: Diagnosis not present

## 2022-05-24 DIAGNOSIS — Z0142 Encounter for cervical smear to confirm findings of recent normal smear following initial abnormal smear: Secondary | ICD-10-CM | POA: Diagnosis not present

## 2022-05-24 DIAGNOSIS — Z6821 Body mass index (BMI) 21.0-21.9, adult: Secondary | ICD-10-CM | POA: Diagnosis not present

## 2022-05-24 DIAGNOSIS — Z1231 Encounter for screening mammogram for malignant neoplasm of breast: Secondary | ICD-10-CM | POA: Diagnosis not present

## 2022-05-24 DIAGNOSIS — N915 Oligomenorrhea, unspecified: Secondary | ICD-10-CM | POA: Diagnosis not present

## 2022-05-24 DIAGNOSIS — Z124 Encounter for screening for malignant neoplasm of cervix: Secondary | ICD-10-CM | POA: Diagnosis not present

## 2022-06-05 ENCOUNTER — Ambulatory Visit: Payer: BC Managed Care – PPO | Admitting: Podiatry

## 2022-06-05 ENCOUNTER — Other Ambulatory Visit: Payer: BC Managed Care – PPO

## 2022-06-26 ENCOUNTER — Ambulatory Visit: Payer: BC Managed Care – PPO | Admitting: Podiatry

## 2022-06-26 ENCOUNTER — Other Ambulatory Visit: Payer: BC Managed Care – PPO

## 2022-07-16 DIAGNOSIS — M9901 Segmental and somatic dysfunction of cervical region: Secondary | ICD-10-CM | POA: Diagnosis not present

## 2022-07-16 DIAGNOSIS — M9902 Segmental and somatic dysfunction of thoracic region: Secondary | ICD-10-CM | POA: Diagnosis not present

## 2022-07-16 DIAGNOSIS — M546 Pain in thoracic spine: Secondary | ICD-10-CM | POA: Diagnosis not present

## 2022-07-16 DIAGNOSIS — M542 Cervicalgia: Secondary | ICD-10-CM | POA: Diagnosis not present

## 2022-07-18 DIAGNOSIS — M9907 Segmental and somatic dysfunction of upper extremity: Secondary | ICD-10-CM | POA: Diagnosis not present

## 2022-07-18 DIAGNOSIS — M9902 Segmental and somatic dysfunction of thoracic region: Secondary | ICD-10-CM | POA: Diagnosis not present

## 2022-07-18 DIAGNOSIS — M542 Cervicalgia: Secondary | ICD-10-CM | POA: Diagnosis not present

## 2022-07-18 DIAGNOSIS — M546 Pain in thoracic spine: Secondary | ICD-10-CM | POA: Diagnosis not present

## 2022-07-18 DIAGNOSIS — M9901 Segmental and somatic dysfunction of cervical region: Secondary | ICD-10-CM | POA: Diagnosis not present

## 2022-07-30 DIAGNOSIS — M542 Cervicalgia: Secondary | ICD-10-CM | POA: Diagnosis not present

## 2022-07-30 DIAGNOSIS — M9901 Segmental and somatic dysfunction of cervical region: Secondary | ICD-10-CM | POA: Diagnosis not present

## 2022-07-30 DIAGNOSIS — M9907 Segmental and somatic dysfunction of upper extremity: Secondary | ICD-10-CM | POA: Diagnosis not present

## 2022-07-30 DIAGNOSIS — M9902 Segmental and somatic dysfunction of thoracic region: Secondary | ICD-10-CM | POA: Diagnosis not present

## 2022-07-30 DIAGNOSIS — M546 Pain in thoracic spine: Secondary | ICD-10-CM | POA: Diagnosis not present

## 2022-09-17 ENCOUNTER — Other Ambulatory Visit: Payer: Self-pay | Admitting: Pulmonary Disease

## 2022-09-17 DIAGNOSIS — R918 Other nonspecific abnormal finding of lung field: Secondary | ICD-10-CM

## 2022-09-19 DIAGNOSIS — E7800 Pure hypercholesterolemia, unspecified: Secondary | ICD-10-CM | POA: Diagnosis not present

## 2022-09-19 DIAGNOSIS — Z1329 Encounter for screening for other suspected endocrine disorder: Secondary | ICD-10-CM | POA: Diagnosis not present

## 2022-09-19 DIAGNOSIS — N951 Menopausal and female climacteric states: Secondary | ICD-10-CM | POA: Diagnosis not present

## 2022-09-19 DIAGNOSIS — Z131 Encounter for screening for diabetes mellitus: Secondary | ICD-10-CM | POA: Diagnosis not present

## 2022-09-19 DIAGNOSIS — R635 Abnormal weight gain: Secondary | ICD-10-CM | POA: Diagnosis not present

## 2022-09-19 DIAGNOSIS — R7989 Other specified abnormal findings of blood chemistry: Secondary | ICD-10-CM | POA: Diagnosis not present

## 2022-09-20 ENCOUNTER — Other Ambulatory Visit: Payer: BC Managed Care – PPO

## 2022-09-24 DIAGNOSIS — Z1339 Encounter for screening examination for other mental health and behavioral disorders: Secondary | ICD-10-CM | POA: Diagnosis not present

## 2022-09-24 DIAGNOSIS — E7800 Pure hypercholesterolemia, unspecified: Secondary | ICD-10-CM | POA: Diagnosis not present

## 2022-09-24 DIAGNOSIS — Z1331 Encounter for screening for depression: Secondary | ICD-10-CM | POA: Diagnosis not present

## 2022-09-24 DIAGNOSIS — Z6821 Body mass index (BMI) 21.0-21.9, adult: Secondary | ICD-10-CM | POA: Diagnosis not present

## 2022-09-24 IMAGING — CT CT CHEST HIGH RESOLUTION W/O CM
2 of 8 series · 12 of 36 positions shown, 15 images · non-contrast
Comparison: Chest CT 09/04/2006.

CLINICAL DATA: 55-year-old female with history of Hodgkin's
lymphoma status post bleomycin therapy. Evaluate for interstitial
lung disease.

EXAM:
CT CHEST WITHOUT CONTRAST
TECHNIQUE: Multidetector CT imaging of the chest was performed following the
standard protocol without intravenous contrast. High resolution
imaging of the lungs, as well as inspiratory and expiratory imaging,
was performed.

[Series 5: chest 2.00 br36 s3 cor soft · coronal · 0.69mm/px · 3 of 144 slices shown]
[im 29/144  lung]
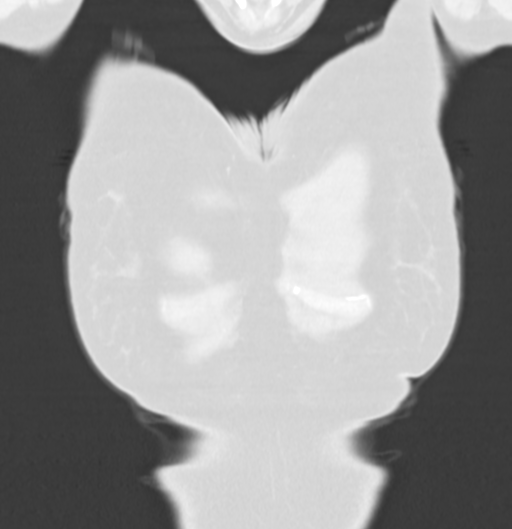
[im 58/144  lung]
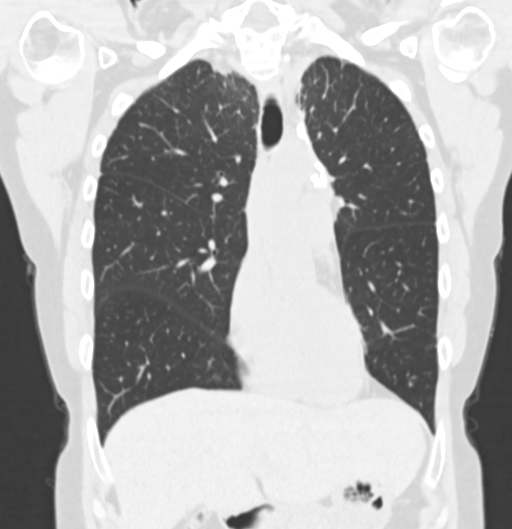
[im 86/144  lung]
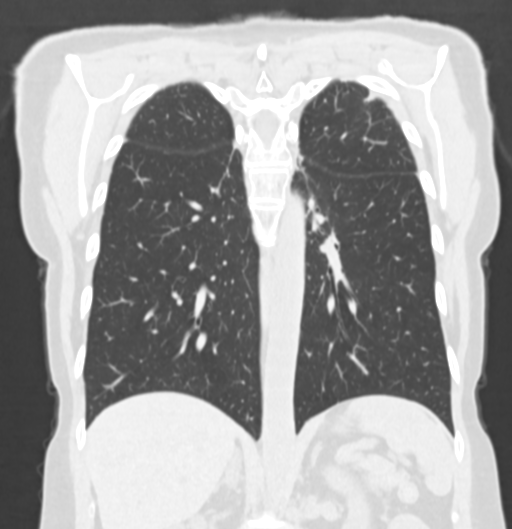

[Series 12: chest 1.00 br60 s3 high res thins 1x1 mm · axial · 0.57mm/px · z∈[+1665,+1969]mm · 9 of 366 slices shown, 12 images]
[im 31/366  mediastinal]
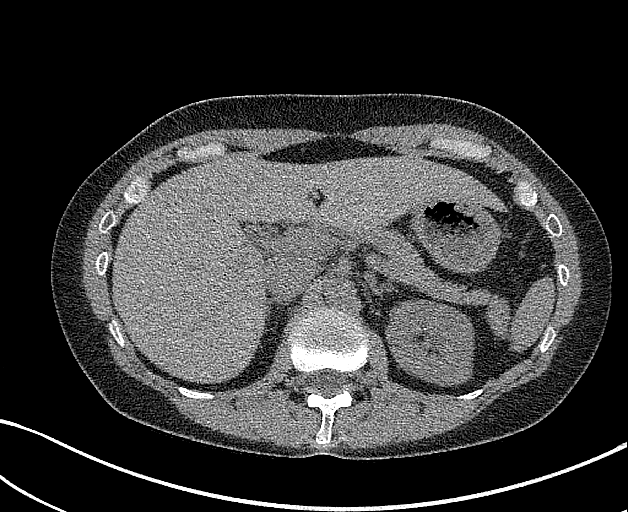
[im 31/366  lung]
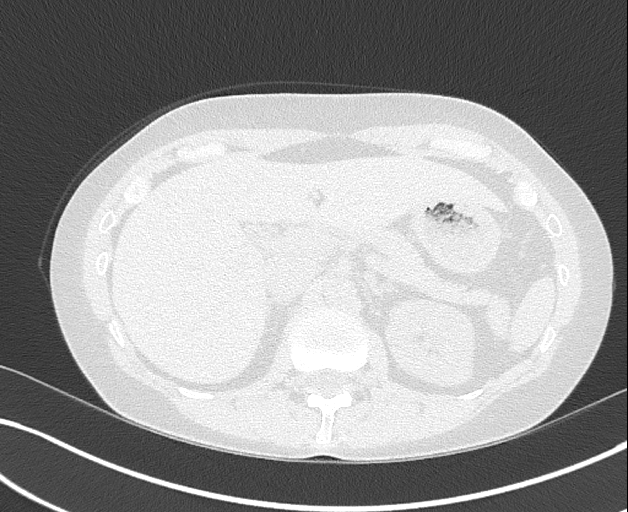
[im 61/366  lung]
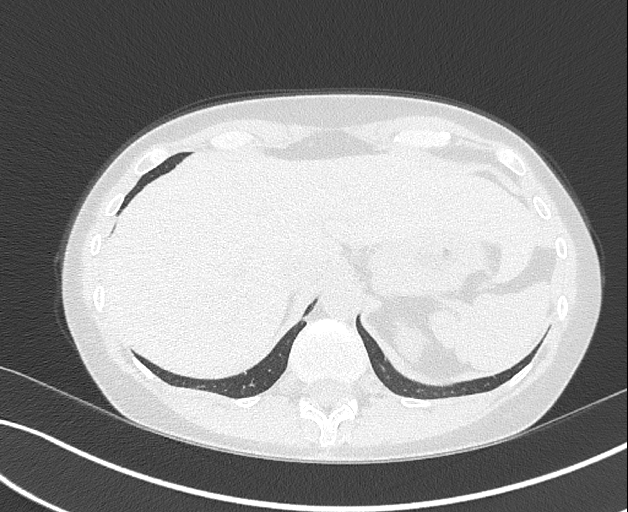
[im 122/366  lung]
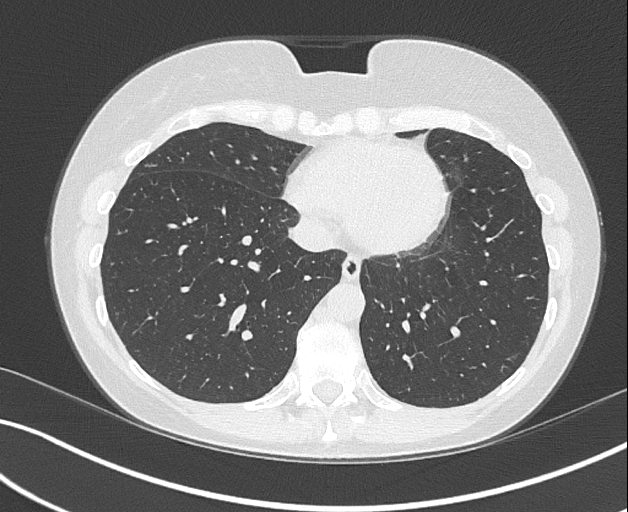
[im 153/366  lung]
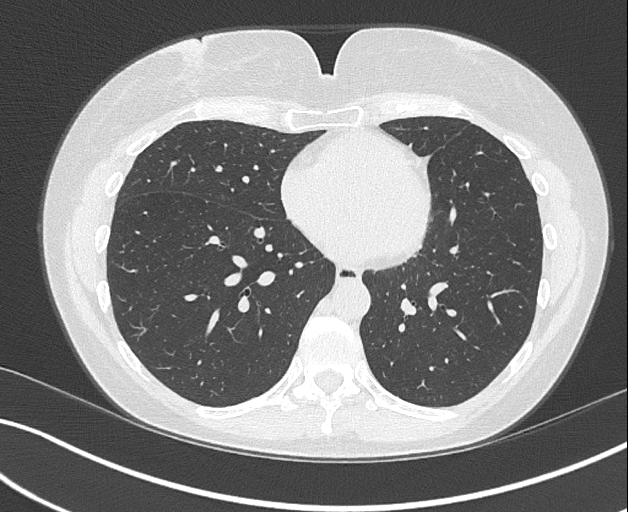
[im 183/366  mediastinal]
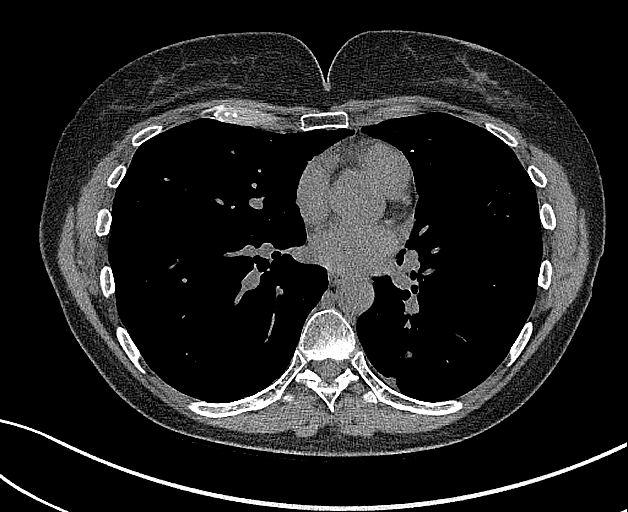
[im 183/366  lung]
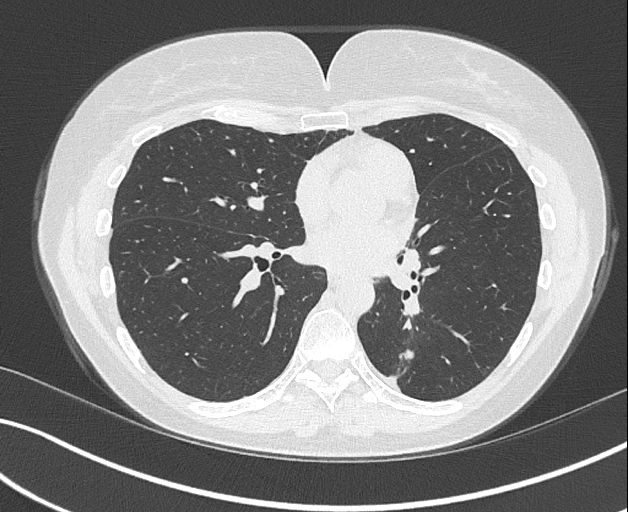
[im 213/366  lung]
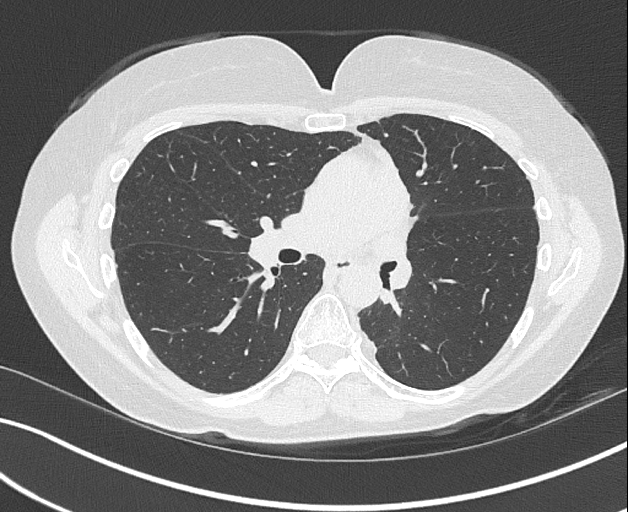
[im 244/366  lung]
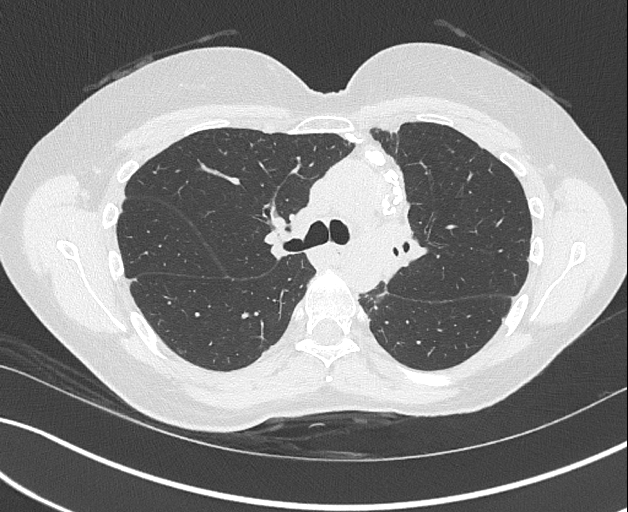
[im 305/366  lung]
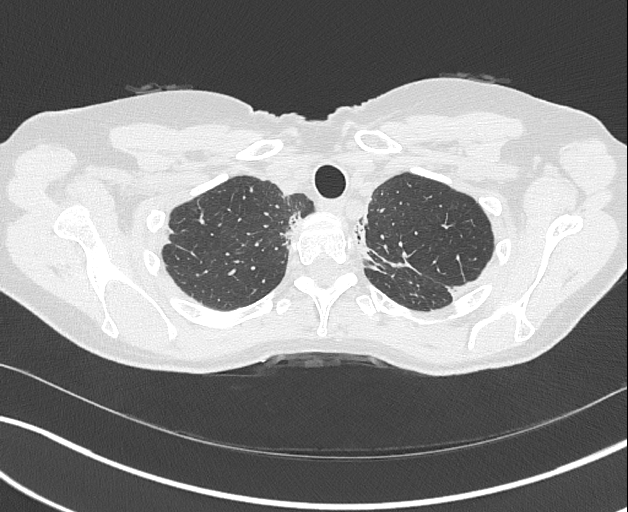
[im 335/366  mediastinal]
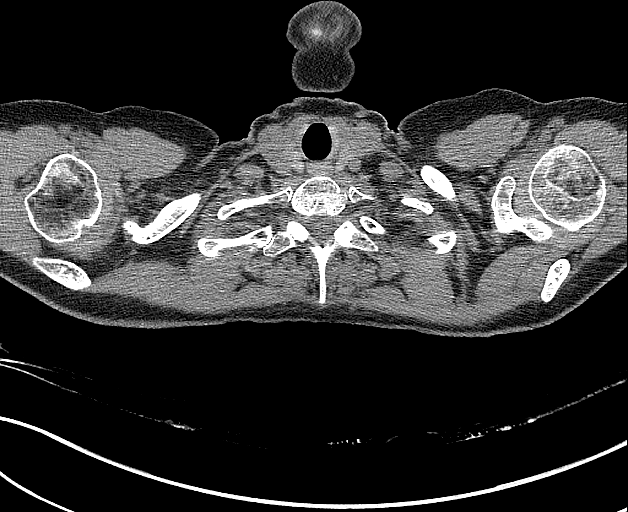
[im 335/366  lung]
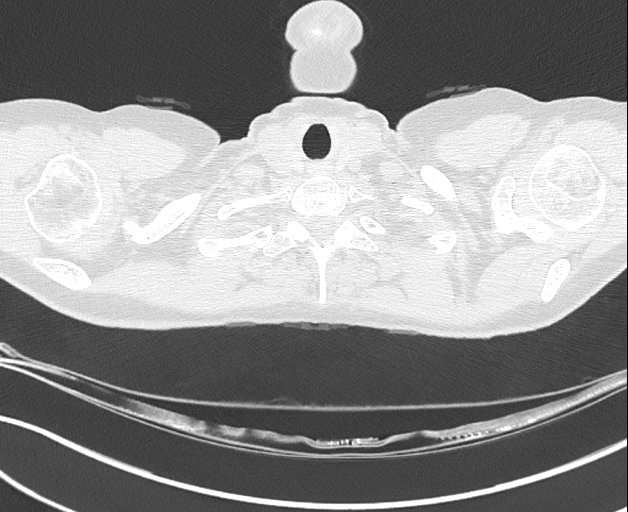

[12 of 36 positions shown; findings below may reference images not displayed]

FINDINGS: Cardiovascular: Heart size is normal. There is no significant
pericardial fluid, thickening or pericardial calcification. There is
aortic atherosclerosis, as well as atherosclerosis of the great
vessels of the mediastinum and the coronary arteries, including
calcified atherosclerotic plaque in the left anterior descending
coronary artery.

Mediastinum/Nodes: No pathologically enlarged mediastinal or hilar
lymph nodes. Multiple densely calcified anterior mediastinal lymph
nodes, presumably sequela of treated lymphoma are incidentally
noted. Esophagus is unremarkable in appearance. No axillary
lymphadenopathy.

Lungs/Pleura: There are some areas of septal thickening, regional
architectural distortion and chronic volume loss in the
paramediastinal aspect of both lungs none as well as in the apical
regions bilaterally, very similar to prior study from 3222, most
compatible with chronic postradiation fibrosis. No generalized
regions of ground-glass attenuation, septal thickening, subpleural
reticulation, traction bronchiectasis or frank honeycombing are
otherwise noted on high-resolution imaging. No acute consolidative
airspace disease. No pleural effusions. In the medial aspect of the
right lower lobe at the base (axial image 129 of series 9) there is
a multilobular nodular opacity which appears fed by several
pulmonary artery branches in drain by several pulmonary venous
branches, compatible with a pulmonary arteriovenous malformation,
very similar to prior study from 3222. In the right lower lobe
(axial image 113 of series 9) there is also a 1.4 x 0.9 cm
ground-glass attenuation nodule, similar in retrospect compared to
prior study from 11/04/2006. No other definite suspicious appearing
pulmonary nodules or masses are noted.

Upper Abdomen: Unremarkable.

Musculoskeletal: There are no aggressive appearing lytic or blastic
lesions noted in the visualized portions of the skeleton.
IMPRESSION: 1. Although there are some areas of mild chronic fibrosis, these
appear very similar to remote prior study from 3222, and are in a
distribution which is most likely related to prior radiation
therapy. No other imaging findings to suggest interstitial lung
disease on today's examination.
2. Pulmonary arteriovenous malformation in the medial aspect of the
right lower lobe at the lung base, similar to prior study from 3222.
3. 1.4 x 0.9 cm ground-glass attenuation nodule in the right lower
lobe (axial image 113 of series 9), similar to prior study from
3222, presumably benign.
4. Aortic atherosclerosis, in addition to left anterior descending
coronary artery disease. Please note that although the presence of
coronary artery calcium documents the presence of coronary artery
disease, the severity of this disease and any potential stenosis
cannot be assessed on this non-gated CT examination. Assessment for
potential risk factor modification, dietary therapy or pharmacologic
therapy may be warranted, if clinically indicated.

Aortic Atherosclerosis ([id number]).

## 2022-10-01 ENCOUNTER — Ambulatory Visit: Payer: BC Managed Care – PPO | Admitting: Pulmonary Disease

## 2022-10-01 DIAGNOSIS — E7800 Pure hypercholesterolemia, unspecified: Secondary | ICD-10-CM | POA: Diagnosis not present

## 2022-10-01 DIAGNOSIS — Z6820 Body mass index (BMI) 20.0-20.9, adult: Secondary | ICD-10-CM | POA: Diagnosis not present

## 2022-10-03 DIAGNOSIS — M549 Dorsalgia, unspecified: Secondary | ICD-10-CM | POA: Diagnosis not present

## 2022-10-03 DIAGNOSIS — J329 Chronic sinusitis, unspecified: Secondary | ICD-10-CM | POA: Diagnosis not present

## 2022-10-03 DIAGNOSIS — Z23 Encounter for immunization: Secondary | ICD-10-CM | POA: Diagnosis not present

## 2022-10-12 DIAGNOSIS — R059 Cough, unspecified: Secondary | ICD-10-CM | POA: Diagnosis not present

## 2022-10-15 DIAGNOSIS — Z6821 Body mass index (BMI) 21.0-21.9, adult: Secondary | ICD-10-CM | POA: Diagnosis not present

## 2022-10-15 DIAGNOSIS — E7800 Pure hypercholesterolemia, unspecified: Secondary | ICD-10-CM | POA: Diagnosis not present

## 2022-10-26 ENCOUNTER — Ambulatory Visit: Payer: BC Managed Care – PPO | Admitting: Pulmonary Disease

## 2022-10-29 DIAGNOSIS — E7800 Pure hypercholesterolemia, unspecified: Secondary | ICD-10-CM | POA: Diagnosis not present

## 2022-10-29 DIAGNOSIS — Z6820 Body mass index (BMI) 20.0-20.9, adult: Secondary | ICD-10-CM | POA: Diagnosis not present

## 2022-12-04 DIAGNOSIS — R053 Chronic cough: Secondary | ICD-10-CM | POA: Diagnosis not present

## 2022-12-06 ENCOUNTER — Telehealth: Payer: Self-pay | Admitting: Pulmonary Disease

## 2022-12-06 NOTE — Telephone Encounter (Signed)
Called patient and did leave a voicemail to let her know she has a CT scan January 8th already scheduled and her follow up with Dr Valeta Harms is January 10th. So he will have the images before her appointment with him. I asked her to call the office back with any concerns or questions. Nothing further needed

## 2022-12-06 NOTE — Telephone Encounter (Signed)
PT has appt w/our practice coming up but thinks she needs a Cscan sched before that visit. Flaring up and Dr. Laqueta Linden states he saw her appt and felt we'd need that before hand.We may not be able to see Einger's notes.  She is sore from coughing. Pls call @ [phone]

## 2022-12-24 ENCOUNTER — Ambulatory Visit
Admission: RE | Admit: 2022-12-24 | Discharge: 2022-12-24 | Disposition: A | Discharge status: Home / Self Care | Payer: BC Managed Care – PPO | Source: Ambulatory Visit | Attending: Pulmonary Disease | Admitting: Pulmonary Disease

## 2022-12-24 DIAGNOSIS — R911 Solitary pulmonary nodule: Secondary | ICD-10-CM | POA: Diagnosis not present

## 2022-12-24 DIAGNOSIS — R918 Other nonspecific abnormal finding of lung field: Secondary | ICD-10-CM

## 2022-12-26 ENCOUNTER — Ambulatory Visit (INDEPENDENT_AMBULATORY_CARE_PROVIDER_SITE_OTHER): Payer: BC Managed Care – PPO | Admitting: Pulmonary Disease

## 2022-12-26 ENCOUNTER — Encounter: Payer: Self-pay | Admitting: Pulmonary Disease

## 2022-12-26 VITALS — BP 100/70 | HR 84 | Ht 67.5 in | Wt 132.2 lb

## 2022-12-26 DIAGNOSIS — J849 Interstitial pulmonary disease, unspecified: Secondary | ICD-10-CM | POA: Diagnosis not present

## 2022-12-26 DIAGNOSIS — Z8571 Personal history of Hodgkin lymphoma: Secondary | ICD-10-CM

## 2022-12-26 DIAGNOSIS — R918 Other nonspecific abnormal finding of lung field: Secondary | ICD-10-CM

## 2022-12-26 DIAGNOSIS — J984 Other disorders of lung: Secondary | ICD-10-CM | POA: Diagnosis not present

## 2022-12-26 DIAGNOSIS — R053 Chronic cough: Secondary | ICD-10-CM

## 2022-12-26 DIAGNOSIS — Q2572 Congenital pulmonary arteriovenous malformation: Secondary | ICD-10-CM

## 2022-12-26 DIAGNOSIS — T451X5A Adverse effect of antineoplastic and immunosuppressive drugs, initial encounter: Secondary | ICD-10-CM

## 2022-12-26 MED ORDER — FLUTICASONE PROPIONATE 50 MCG/ACT NA SUSP: 2.0000 | Freq: Two times a day (BID) | NASAL | 2 refills | Status: AC

## 2022-12-26 MED ORDER — FEXOFENADINE HCL 180 MG PO TABS: 180.0000 mg | ORAL_TABLET | Freq: Every day | ORAL | 2 refills | Status: DC

## 2022-12-26 NOTE — Progress Notes (Signed)
Synopsis: Referred in September 2022 for abnormal chest x-ray by Gaynelle Arabian, MD  Subjective:   PATIENT ID: Shannon Mann GENDER: female DOB: June 15, 1966, MRN: [id number]  Chief Complaint  Patient presents with   Follow-up    CT    This is a 57 year old female, diagnosed with Hodgkin's lymphoma 2005-2008 status post bleomycin treatment.  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 She feels like her lungs have been messed up since her treatment. She was treated with bleomycin can she had to stop the use of bleo during the treatments because she had drop in her lung function at that time. She was having trouble with her ADLs at that time. Over the years she has noticed that when she got sick she would take steroids as a last resort. She does occasionally have sinus infections. She had a cough that she justed handle. She coughed so much this past summer end of august that he hurt her ribs. Once she started steroids it made her cough go away. She remembers that when she was running to a flight in the airport she felt like she was doing to die. She use to swim regularly prior to the pandemic.   OV 10/06/2021: Here today for follow-up after recent HRCT of the chest as well as pulmonary function test.  Patient had pulmonary function completed which we reviewed today in the office.  She has mildly impaired spirometry, impaired FEV1 with an FEV1 of 77% and 79% predicted postbronchodilator response.  Normal TLC and a normal DLCO.  She also had an HRCT scan of the chest that was completed which revealed stability of the right lower lobe pulmonary arteriovenous malformation.  Also has paramediastinal chronic fibrotic changes as well as a small area of focal bronchiectasis in the right lower lobe groundglass opacity.  I suspect these fibrotic changes are related to her previous radiation for Hodgkin's lymphoma.  OV 12/26/2022: Here today for follow-up  CT chest.CT chest 12/24/2022 was completed.  Patient has an area of focal pulmonary fibrosis in both lungs with no significant interval change.  This related to her previous radiation treatments.  She has a 1.3 cm area of groundglass within the right lower lobe no change.  No other adenopathy seen. Overall just dealing with an ongoing cough.  The cough at this point is driving her crazy.  Its dry and nonproductive.  She was given antibiotics and a short course of steroids.  The steroids improved her cough for short-term.  She was placed on albuterol inhaler to be used as needed as well as Symbicort 160 twice daily.      Past Medical History:  Diagnosis Date   Allergic rhinitis    Hodgkin disease ([place]) 2005   rx with bleo then xrt   Pulmonary arteriovenous aneurysm    RLL by ct chest 2009     Family History  Problem Relation Age of Onset   Hyperlipidemia Mother    Emphysema Maternal Grandmother    Allergies Other        children   Heart disease Paternal Grandmother    Hodgkin's lymphoma Father      Past Surgical History:  Procedure Laterality Date   LYMPH NODE BIOPSY  2003    Social History   Socioeconomic History   Marital status: Married    Spouse name: Not on file   Number of children: 2   Years of education: Not on  file   Highest education level: Not on file  Occupational History   Occupation: stay at home mother    Employer: UNEMPLOYED  Tobacco Use   Smoking status: Never   Smokeless tobacco: Never  Substance and Sexual Activity   Alcohol use: No   Drug use: No   Sexual activity: Not on file  Other Topics Concern   Not on file  Social History Narrative   Not on file   Social Determinants of Health   Financial Resource Strain: Not on file  Food Insecurity: Not on file  Transportation Needs: Not on file  Physical Activity: Not on file  Stress: Not on file  Social Connections: Not on file  Intimate Partner Violence: Not on file     No Known Allergies    Outpatient Medications Prior to Visit  Medication Sig Dispense Refill   albuterol (VENTOLIN HFA) 108 (90 Base) MCG/ACT inhaler      SYMBICORT 160-4.5 MCG/ACT inhaler Inhale 2 puffs into the lungs 2 (two) times daily.     cyclobenzaprine (FLEXERIL) 5 MG tablet Take 5 mg by mouth 3 (three) times daily as needed.     levonorgestrel (MIRENA, 52 MG,) 20 MCG/DAY IUD Mirena 20 mcg/24 hours (8 yrs) 52 mg intrauterine device  Take by intrauterine route.     Multiple Vitamin (MULTIVITAMIN) tablet Take 1 tablet by mouth daily.     No facility-administered medications prior to visit.    Review of Systems  Constitutional:  Negative for chills, fever, malaise/fatigue and weight loss.  HENT:  Negative for hearing loss, sore throat and tinnitus.   Eyes:  Negative for blurred vision and double vision.  Respiratory:  Positive for cough. Negative for hemoptysis, sputum production, shortness of breath, wheezing and stridor.   Cardiovascular:  Negative for chest pain, palpitations, orthopnea, leg swelling and PND.  Gastrointestinal:  Negative for abdominal pain, constipation, diarrhea, heartburn, nausea and vomiting.  Genitourinary:  Negative for dysuria, hematuria and urgency.  Musculoskeletal:  Negative for joint pain and myalgias.  Skin:  Negative for itching and rash.  Neurological:  Negative for dizziness, tingling, weakness and headaches.  Endo/Heme/Allergies:  Negative for environmental allergies. Does not bruise/bleed easily.  Psychiatric/Behavioral:  Negative for depression. The patient is not nervous/anxious and does not have insomnia.   All other systems reviewed and are negative.    Objective:  Physical Exam Vitals reviewed.  Constitutional:      General: She is not in acute distress.    Appearance: She is well-developed.  HENT:     Head: Normocephalic and atraumatic.  Eyes:     General: No scleral icterus.    Conjunctiva/sclera: Conjunctivae normal.     Pupils: Pupils are equal,  round, and reactive to light.  Neck:     Vascular: No JVD.     Trachea: No tracheal deviation.  Cardiovascular:     Rate and Rhythm: Normal rate and regular rhythm.     Heart sounds: Normal heart sounds. No murmur heard. Pulmonary:     Effort: Pulmonary effort is normal. No tachypnea, accessory muscle usage or respiratory distress.     Breath sounds: No stridor. No wheezing, rhonchi or rales.  Abdominal:     General: There is no distension.     Palpations: Abdomen is soft.     Tenderness: There is no abdominal tenderness.  Musculoskeletal:        General: No tenderness.     Cervical back: Neck supple.  Lymphadenopathy:  Cervical: No cervical adenopathy.  Skin:    General: Skin is warm and dry.     Capillary Refill: Capillary refill takes less than 2 seconds.     Findings: No rash.  Neurological:     Mental Status: She is alert and oriented to person, place, and time.  Psychiatric:        Behavior: Behavior normal.      Vitals:   12/26/22 1307  BP: 100/70  Pulse: 84  SpO2: 96%  Weight: 132 lb 3.2 oz (60 kg)  Height: 5' 7.5" (1.715 m)    96% on RA BMI Readings from Last 3 Encounters:  12/26/22 20.40 kg/m  10/06/21 20.99 kg/m  09/07/21 21.60 kg/m   Wt Readings from Last 3 Encounters:  12/26/22 132 lb 3.2 oz (60 kg)  10/06/21 136 lb (61.7 kg)  09/07/21 140 lb (63.5 kg)     CBC    Component Value Date/Time   WBC 4.5 06/24/2020 1142   WBC 4.4 07/19/2008 0910   RBC 4.81 06/24/2020 1142   HGB 14.5 06/24/2020 1142   HGB 14.9 07/19/2008 0910   HCT 43.8 06/24/2020 1142   HCT 43.2 07/19/2008 0910   PLT 355 06/24/2020 1142   PLT 319 07/19/2008 0910   MCV 91.1 06/24/2020 1142   MCV 90.9 07/19/2008 0910   MCH 30.1 06/24/2020 1142   MCHC 33.1 06/24/2020 1142   RDW 12.5 06/24/2020 1142   RDW 11.8 07/19/2008 0910   LYMPHSABS 1.6 06/24/2020 1142   LYMPHSABS 1.3 07/19/2008 0910   MONOABS 0.4 06/24/2020 1142   MONOABS 0.3 07/19/2008 0910   EOSABS 0.2  06/24/2020 1142   EOSABS 0.1 07/19/2008 0910   BASOSABS 0.1 06/24/2020 1142   BASOSABS 0.0 07/19/2008 0910     Chest Imaging: 08/07/2021 chest x-ray: Apical pleural-parenchymal scarring, calcified mediastinal nodes scarring in the left infrahilar lung. The patient's images have been independently reviewed by me.    October 2022 HRCT chest: Stable right lower lobe pulmonary arteriovenous malformation, right lower lobe groundglass opacity, paramediastinal subpleural reticulations and evidence of chronic fibrosis, small area of bronchiectasis I suspect related to her previous radiation. The patient's images have been independently reviewed by me.    January 2024 CT chest: Stable fibrosis paramedial, adjacent to mediastinum Stable groundglass opacity in the right lower lobe. The patient's images have been independently reviewed by me.    Pulmonary Functions Testing Results:    Latest Ref Rng & Units 10/06/2021    2:41 PM  PFT Results  FVC-Pre L 3.16   FVC-Predicted Pre % 82   FVC-Post L 3.34   FVC-Predicted Post % 86   Pre FEV1/FVC % % 74   Post FEV1/FCV % % 72   FEV1-Pre L 2.34   FEV1-Predicted Pre % 77   FEV1-Post L 2.41   DLCO uncorrected ml/min/mmHg 22.53   DLCO UNC% % 98   DLCO corrected ml/min/mmHg 22.53   DLCO COR %Predicted % 98   DLVA Predicted % 109   TLC L 5.51   TLC % Predicted % 100   RV % Predicted % 110     FeNO:   Pathology:   Echocardiogram:   Heart Catheterization:     Assessment & Plan:     ICD-10-CM   1. Interstitial pulmonary disease (HCC)  J84.9     2. history of Bleomycin lung toxicity  J98.4    T45.1X5A     3. History of Hodgkin's disease  Z85.71     4.  Ground glass opacity present on imaging of lung  R91.8     5. Pulmonary arteriovenous malformation  Q25.72       Discussion:  This is a 57 year old female, history of Hodgkin's disease, status post radiation to the chest, bleomycin therapy, PFTs, stable in the past with no  evidence of restriction, mildly impaired spirometry, TLC of 100% DLCO of 98%.  Right lower lobe groundglass opacity stable.  Also has a right lower lobe.  Also has a right lower lobe pulmonary AVM.  Plan: Referral placed to vascular interventional radiology for evaluation of pulmonary AVM. He has a rather large feeding vessel, measures 3.25 mm. And may benefit from coiling or embolotherapy at some point in time. As for her cough working to try to treat that from a more conservative approach with adding intranasal steroid and antihistamines.  I do think she needs to continue the ICS/LABA inhaler. This seems to be more of a allergic type mediated problem.  Or may be something that she is exposed to that is causing his issues. Patient is agreeable to that plan. New prescription sent to pharmacy for nasal spray and antihistamine. We also going to send her for a blood testing for regional allergy panel. She is going to send me a MyChart message in a couple weeks to see how she is doing.  We may make some changes to medications at that time.    Current Outpatient Medications:    albuterol (VENTOLIN HFA) 108 (90 Base) MCG/ACT inhaler, , Disp: , Rfl:    SYMBICORT 160-4.5 MCG/ACT inhaler, Inhale 2 puffs into the lungs 2 (two) times daily., Disp: , Rfl:    cyclobenzaprine (FLEXERIL) 5 MG tablet, Take 5 mg by mouth 3 (three) times daily as needed., Disp: , Rfl:    levonorgestrel (MIRENA, 52 MG,) 20 MCG/DAY IUD, Mirena 20 mcg/24 hours (8 yrs) 52 mg intrauterine device  Take by intrauterine route., Disp: , Rfl:    Multiple Vitamin (MULTIVITAMIN) tablet, Take 1 tablet by mouth daily., Disp: , Rfl:    Garner Nash, DO [facility] 12/26/2022 1:13 PM

## 2022-12-26 NOTE — Addendum Note (Signed)
Addended by: Suzzanne Cloud E on: 12/26/2022 02:11 PM   Modules accepted: Orders

## 2022-12-26 NOTE — Patient Instructions (Signed)
Thank you for visiting Dr. Valeta Harms at [facility]. Today we recommend the following:  Orders Placed This Encounter  Procedures   REGIONAL PANEL 2   Ambulatory referral to Interventional Radiology   Meds ordered this encounter  Medications   fluticasone (FLONASE) 50 MCG/ACT nasal spray    Sig: Place 2 sprays into both nostrils 2 (two) times daily.    Dispense:  16 g    Refill:  2   fexofenadine (ALLEGRA) 180 MG tablet    Sig: Take 1 tablet (180 mg total) by mouth daily.    Dispense:  30 tablet    Refill:  2   Return if symptoms worsen or fail to improve.    Please do your part to reduce the spread of COVID-19.

## 2022-12-27 ENCOUNTER — Other Ambulatory Visit: Payer: Self-pay | Admitting: Pulmonary Disease

## 2022-12-27 DIAGNOSIS — J849 Interstitial pulmonary disease, unspecified: Secondary | ICD-10-CM

## 2022-12-31 LAB — REGIONAL PANEL 2
012-IgE Goldenrod: <0.1 kU/L
Amer Sycamore IgE Qn: <0.1 kU/L
Bahia Grass IgE: <0.1 kU/L
Bermuda Grass IgE: <0.1 kU/L
Cocklebur IgE: <0.1 kU/L
Cottonwood IgE: <0.1 kU/L
Elm, American IgE: <0.1 kU/L
Hickory, White IgE: <0.1 kU/L
Johnson Grass IgE: <0.1 kU/L
Kentucky Bluegrass IgE: <0.1 kU/L
Lamb's Quarters IgE: <0.1 kU/L
Maple/Box Elder IgE: 0.16 kU/L — AB
Oak, White IgE: <0.1 kU/L
Pigweed, Rough IgE: <0.1 kU/L
Plantain, English IgE: <0.1 kU/L
Ragweed, Short IgE: <0.1 kU/L
T005-IgE Beech (American): <0.1 kU/L
T010-IgE Walnut: <0.1 kU/L
T012-IgE Willow: <0.1 kU/L
T015-IgE Ash, White: <0.1 kU/L
W004-IgE Ragweed, False: <0.1 kU/L

## 2022-12-31 NOTE — Progress Notes (Signed)
Please let patient know that her regional allergy panel was normal   Thanks,  BLI  Garner Nash, DO [facility] 12/31/2022 2:13 PM

## 2023-01-03 ENCOUNTER — Other Ambulatory Visit: Payer: BC Managed Care – PPO

## 2023-01-03 ENCOUNTER — Ambulatory Visit: Payer: BC Managed Care – PPO | Admitting: Podiatry

## 2023-01-07 ENCOUNTER — Ambulatory Visit (INDEPENDENT_AMBULATORY_CARE_PROVIDER_SITE_OTHER): Payer: BC Managed Care – PPO | Admitting: Podiatry

## 2023-01-07 ENCOUNTER — Ambulatory Visit (INDEPENDENT_AMBULATORY_CARE_PROVIDER_SITE_OTHER): Payer: BC Managed Care – PPO

## 2023-01-07 DIAGNOSIS — M722 Plantar fascial fibromatosis: Secondary | ICD-10-CM

## 2023-01-07 DIAGNOSIS — M21619 Bunion of unspecified foot: Secondary | ICD-10-CM

## 2023-01-07 DIAGNOSIS — M79671 Pain in right foot: Secondary | ICD-10-CM

## 2023-01-07 DIAGNOSIS — B351 Tinea unguium: Secondary | ICD-10-CM | POA: Diagnosis not present

## 2023-01-07 DIAGNOSIS — Z79899 Other long term (current) drug therapy: Secondary | ICD-10-CM | POA: Diagnosis not present

## 2023-01-07 DIAGNOSIS — M79672 Pain in left foot: Secondary | ICD-10-CM | POA: Diagnosis not present

## 2023-01-07 MED ORDER — EFINACONAZOLE 10 % EX SOLN
1.0000 [drp] | Freq: Every day | CUTANEOUS | 11 refills | Status: DC
Start: 2023-01-07 — End: 2023-10-07

## 2023-01-07 NOTE — Patient Instructions (Signed)
Terbinafine Tablets What is this medication? TERBINAFINE (TER bin a feen) treats fungal infections of the nails. It belongs to a group of medications called antifungals. It will not treat infections caused by bacteria or viruses. This medicine may be used for other purposes; ask your health care provider or pharmacist if you have questions. COMMON BRAND NAME(S): Lamisil, Terbinex What should I tell my care team before I take this medication? They need to know if you have any of these conditions: Liver disease An unusual or allergic reaction to terbinafine, other medications, foods, dyes, or preservatives Pregnant or trying to get pregnant Breast-feeding How should I use this medication? Take this medication by mouth with water. Take it as directed on the prescription label at the same time every day. You can take it with or without food. If it upsets your stomach, take it with food. Keep taking it unless your care team tells you to stop. A special MedGuide will be given to you by the pharmacist with each prescription and refill. Be sure to read this information carefully each time. Talk to your care team regarding the use of this medication in children. Special care may be needed. Overdosage: If you think you have taken too much of this medicine contact a poison control center or emergency room at once. NOTE: This medicine is only for you. Do not share this medicine with others. What if I miss a dose? If you miss a dose, take it as soon as you can unless it is more than 4 hours late. If it is more than 4 hours late, skip the missed dose. Take the next dose at the normal time. What may interact with this medication? Do not take this medication with any of the following: Pimozide Thioridazine This medication may also interact with the following: Beta blockers Caffeine Certain medications for mental health conditions Cimetidine Cyclosporine Medications for fungal infections like fluconazole  and ketoconazole Medications for irregular heartbeat like amiodarone, flecainide and propafenone Rifampin Warfarin This list may not describe all possible interactions. Give your health care provider a list of all the medicines, herbs, non-prescription drugs, or dietary supplements you use. Also tell them if you smoke, drink alcohol, or use illegal drugs. Some items may interact with your medicine. What should I watch for while using this medication? Visit your care team for regular checks on your progress. You may need blood work while you are taking this medication. It may be some time before you see the benefit from this medication. This medication may cause serious skin reactions. They can happen weeks to months after starting the medication. Contact your care team right away if you notice fevers or flu-like symptoms with a rash. The rash may be red or purple and then turn into blisters or peeling of the skin. Or, you might notice a red rash with swelling of the face, lips or lymph nodes in your neck or under your arms. This medication can make you more sensitive to the sun. Keep out of the sun, If you cannot avoid being in the sun, wear protective clothing and sunscreen. Do not use sun lamps or tanning beds/booths. What side effects may I notice from receiving this medication? Side effects that you should report to your care team as soon as possible: Allergic reactions--skin rash, itching, hives, swelling of the face, lips, tongue, or throat Change in sense of smell Change in taste Infection--fever, chills, cough, or sore throat Liver injury--right upper belly pain, loss of appetite, nausea,  light-colored stool, dark yellow or brown urine, yellowing skin or eyes, unusual weakness or fatigue Low red blood cell level--unusual weakness or fatigue, dizziness, headache, trouble breathing Lupus-like syndrome--joint pain, swelling, or stiffness, butterfly-shaped rash on the face, rashes that get worse  in the sun, fever, unusual weakness or fatigue Rash, fever, and swollen lymph nodes Redness, blistering, peeling, or loosening of the skin, including inside the mouth Unusual bruising or bleeding Worsening mood, feelings of depression Side effects that usually do not require medical attention (report to your care team if they continue or are bothersome): Diarrhea Gas Headache Nausea Stomach pain Upset stomach This list may not describe all possible side effects. Call your doctor for medical advice about side effects. You may report side effects to FDA at 1-800-FDA-1088. Where should I keep my medication? Keep out of the reach of children and pets. Store between 20 and 25 degrees C (68 and 77 degrees F). Protect from light. Get rid of any unused medication after the expiration date. To get rid of medications that are no longer needed or have expired: Take the medication to a medication take-back program. Check with your pharmacy or law enforcement to find a location. If you cannot return the medication, check the label or package insert to see if the medication should be thrown out in the garbage or flushed down the toilet. If you are not sure, ask your care team. If it is safe to put it in the trash, take the medication out of the container. Mix the medication with cat litter, dirt, coffee grounds, or other unwanted substance. Seal the mixture in a bag or container. Put it in the trash. NOTE: This sheet is a summary. It may not cover all possible information. If you have questions about this medicine, talk to your doctor, pharmacist, or health care provider.  2023 Elsevier/Gold Standard (2021-06-27 00:00:00)

## 2023-01-08 ENCOUNTER — Ambulatory Visit
Admission: RE | Admit: 2023-01-08 | Discharge: 2023-01-08 | Disposition: A | Discharge status: Home / Self Care | Payer: BC Managed Care – PPO | Source: Ambulatory Visit | Attending: Pulmonary Disease | Admitting: Pulmonary Disease

## 2023-01-08 ENCOUNTER — Other Ambulatory Visit: Payer: Self-pay | Admitting: Podiatry

## 2023-01-08 ENCOUNTER — Other Ambulatory Visit: Payer: Self-pay | Admitting: Interventional Radiology

## 2023-01-08 DIAGNOSIS — J849 Interstitial pulmonary disease, unspecified: Secondary | ICD-10-CM

## 2023-01-08 DIAGNOSIS — Q2572 Congenital pulmonary arteriovenous malformation: Secondary | ICD-10-CM | POA: Diagnosis not present

## 2023-01-08 DIAGNOSIS — Z79899 Other long term (current) drug therapy: Secondary | ICD-10-CM

## 2023-01-08 LAB — HEPATIC FUNCTION PANEL
ALT: 11 IU/L (ref 0–32)
AST: 19 IU/L (ref 0–40)
Albumin: 4.7 g/dL (ref 3.8–4.9)
Alkaline Phosphatase: 76 IU/L (ref 44–121)
Bilirubin Total: 0.3 mg/dL (ref 0.0–1.2)
Bilirubin, Direct: 0.11 mg/dL (ref 0.00–0.40)
Total Protein: 7.2 g/dL (ref 6.0–8.5)

## 2023-01-08 LAB — CBC WITH DIFFERENTIAL/PLATELET
Basophils Absolute: 0.1 10*3/uL (ref 0.0–0.2)
Basos: 1 %
EOS (ABSOLUTE): 0.2 10*3/uL (ref 0.0–0.4)
Eos: 4 %
Hematocrit: 43 % (ref 34.0–46.6)
Hemoglobin: 14.6 g/dL (ref 11.1–15.9)
Immature Grans (Abs): 0 10*3/uL (ref 0.0–0.1)
Immature Granulocytes: 0 %
Lymphocytes Absolute: 2 10*3/uL (ref 0.7–3.1)
Lymphs: 36 %
MCH: 30 pg (ref 26.6–33.0)
MCHC: 34 g/dL (ref 31.5–35.7)
MCV: 89 fL (ref 79–97)
Monocytes Absolute: 0.4 10*3/uL (ref 0.1–0.9)
Monocytes: 7 %
Neutrophils Absolute: 3 10*3/uL (ref 1.4–7.0)
Neutrophils: 52 %
Platelets: 365 10*3/uL (ref 150–450)
RBC: 4.86 x10E6/uL (ref 3.77–5.28)
RDW: 12 % (ref 11.7–15.4)
WBC: 5.6 10*3/uL (ref 3.4–10.8)

## 2023-01-08 MED ORDER — TERBINAFINE HCL 250 MG PO TABS: 250.0000 mg | ORAL_TABLET | Freq: Every day | ORAL | 0 refills | Status: DC

## 2023-01-08 NOTE — Consult Note (Signed)
Chief Complaint: Patient was seen in consultation today for pulmonary AVM at the request of Crellin L  Referring Physician(s): Icard,Bradley L  History of Present Illness: Shannon Mann is a 57 y.o. female with a history of Hodgkin's lymphoma in the mid to late 2000's status post successful bleomycin and radiation treatment.  She was diagnosed with a right lower lobe pulmonary arteriovenous malformation in 2008.  Therapy for her Hodgkin's lymphoma has left her with mild pulmonary dysfunction both from bleomycin toxicity as well as a small amount of radiation fibrosis.  She has found over the years that she is prone to developing upper respiratory infections and cough.  She has not experienced any symptoms of transient ischemic attack or cerebrovascular accident.  She is currently in her usual state of relatively good health.  She is very active although she does experience some shortness of breath with more intense activity.  Past Medical History:  Diagnosis Date   Allergic rhinitis    Hodgkin disease ([place]) 2005   rx with bleo then xrt   Pulmonary arteriovenous aneurysm    RLL by ct chest 2009    Past Surgical History:  Procedure Laterality Date   LYMPH NODE BIOPSY  2003    Allergies: Patient has no known allergies.  Medications: Prior to Admission medications   Medication Sig Start Date End Date Taking? Authorizing Provider  albuterol (VENTOLIN HFA) 108 (90 Base) MCG/ACT inhaler  08/10/21   [provider]  cyclobenzaprine (FLEXERIL) 5 MG tablet Take 5 mg by mouth 3 (three) times daily as needed. 05/01/21   [provider]  Efinaconazole 10 % SOLN Apply 1 drop topically daily. 01/07/23   Trula Slade, DPM  fexofenadine (ALLEGRA) 180 MG tablet Take 1 tablet (180 mg total) by mouth daily. 12/26/22 03/26/23  Icard, Octavio Graves, DO  fluticasone (FLONASE) 50 MCG/ACT nasal spray Place 2 sprays into both nostrils 2 (two) times daily. 12/26/22   Icard,  Octavio Graves, DO  levonorgestrel (MIRENA, 52 MG,) 20 MCG/DAY IUD Mirena 20 mcg/24 hours (8 yrs) 52 mg intrauterine device  Take by intrauterine route. 10/02/16   [provider]  Multiple Vitamin (MULTIVITAMIN) tablet Take 1 tablet by mouth daily.    [provider]  SYMBICORT 160-4.5 MCG/ACT inhaler Inhale 2 puffs into the lungs 2 (two) times daily. 12/04/22   [provider]     Family History  Problem Relation Age of Onset   Hyperlipidemia Mother    Emphysema Maternal Grandmother    Allergies Other        children   Heart disease Paternal Grandmother    Hodgkin's lymphoma Father     Social History   Socioeconomic History   Marital status: Married    Spouse name: Not on file   Number of children: 2   Years of education: Not on file   Highest education level: Not on file  Occupational History   Occupation: stay at home mother    Employer: UNEMPLOYED  Tobacco Use   Smoking status: Never   Smokeless tobacco: Never  Substance and Sexual Activity   Alcohol use: No   Drug use: No   Sexual activity: Not on file  Other Topics Concern   Not on file  Social History Narrative   Not on file   Social Determinants of Health   Financial Resource Strain: Not on file  Food Insecurity: Not on file  Transportation Needs: Not on file  Physical Activity: Not on file  Stress: Not on file  Social Connections: Not on file     Review of Systems: A 12 point ROS discussed and pertinent positives are indicated in the HPI above.  All other systems are negative.  Review of Systems  Vital Signs: BP 132/79 (BP Location: Left Arm, Patient Position: Sitting, Cuff Size: Normal)   Pulse 84   Temp 98.4 F (36.9 C) (Oral)   Wt 59.9 kg   SpO2 93% Comment: room air  BMI 20.37 kg/m     Physical Exam Constitutional:      Appearance: Normal appearance. She is normal weight.  HENT:     Head: Normocephalic and atraumatic.  Eyes:     General: No scleral  icterus. Cardiovascular:     Rate and Rhythm: Normal rate.  Pulmonary:     Effort: Pulmonary effort is normal.  Abdominal:     General: Abdomen is flat. There is no distension.  Skin:    General: Skin is warm and dry.  Neurological:     Mental Status: She is alert and oriented to person, place, and time.  Psychiatric:        Mood and Affect: Mood normal.        Behavior: Behavior normal.       Imaging: CT CHEST WO CONTRAST  Result Date: 12/25/2022 CLINICAL DATA:  Follow-up of lung nodule EXAM: CT CHEST WITHOUT CONTRAST TECHNIQUE: Multidetector CT imaging of the chest was performed following the standard protocol without IV contrast. RADIATION DOSE REDUCTION: This exam was performed according to the departmental dose-optimization program which includes automated exposure control, adjustment of the mA and/or kV according to patient size and/or use of iterative reconstruction technique. COMPARISON:  Previous studies including the examination of 10/02/2021 FINDINGS: Cardiovascular: There are scattered coronary artery calcifications. Minimal calcification is noted in aortic arch. Mediastinum/Nodes: There are calcified lymph nodes in mediastinum. Lungs/Pleura: There are linear densities along the medial aspect of left lung along with ectasia or bronchi. Similar finding is seen in the posterior left apex and medial right apex. These findings appear stable. There are prominent tortuous vascular structures in the inferior aspect of right hilum suggesting AV malformation with no interval change. In image 116 of series 5, there is 1.3 x 0.6 cm area of ground-glass density in the lateral aspect of right lower lobe with no significant change. In image 53, there is 5 mm area of increased interstitial markings in lateral right upper lobe which has not changed. Upper Abdomen: No acute findings are seen. Musculoskeletal: Unremarkable. IMPRESSION: There are foci of pulmonary fibrosis in both lungs with no  significant interval change. There is 1.3 cm area of ground-glass density in right lower lobe with no interval change. There are no new nodules or new infiltrates in the lung fields. No new significant lymphadenopathy is seen in mediastinum. Coronary artery disease. Electronically Signed   By: Elmer Picker M.D.   On: 12/25/2022 11:40    Labs:  CBC: Recent Labs    01/07/23 1620  WBC 5.6  HGB 14.6  HCT 43.0  PLT 365    COAGS: No results for input(s): "INR", "APTT" in the last 8760 hours.  BMP: No results for input(s): "NA", "K", "CL", "CO2", "GLUCOSE", "BUN", "CALCIUM", "CREATININE", "GFRNONAA", "GFRAA" in the last 8760 hours.  Invalid input(s): "CMP"  LIVER FUNCTION TESTS: Recent Labs    01/07/23 1620  BILITOT 0.3  AST 19  ALT 11  ALKPHOS 76  PROT 7.2  ALBUMIN 4.7    TUMOR  MARKERS: No results for input(s): "AFPTM", "CEA", "CA199", "CHROMGRNA" in the last 8760 hours.  Assessment and Plan:  Extremely pleasant 57 year old female with a pulmonary arteriovenous malformation in the inferior aspect of the right lower lobe.  The pulmonary arterial component measures at least 3-4 mm in diameter which is large enough to provide a left to right shunt and a potential nidus for paradoxical cerebrovascular accident. Therefore, she is a candidate for endovascular embolization of this small AVM.  We discussed the natural history of pulmonary AVMs as well as the treatment options, risks, benefits and alternatives.  It has been a significant amount of time since she has had a CT scan of the chest with contrast (November 2007).  The for step is obtaining a new high-quality CTA of the chest.  We will then utilize that imaging for procedural planning.  I told Mrs. Nave I would call her following the CT scan to discuss 1 final time before proceeding with treatment.  I will also review the images and plan with my partner, Dr. Kathlene Cote, who also has extensive experience in treating pulmonary  AVMs.  1.) CTA chest (pulmonary artery phase) with contrast  2.) Follow up phone call prior to scheduling treatment  Thank you for this interesting consult.  I greatly enjoyed meeting Shannon Mann and look forward to participating in their care.  A copy of this report was sent to the requesting provider on this date.  Electronically Signed: Criselda Peaches 01/08/2023, 12:19 PM   I spent a total of  40 Minutes   in face to face in clinical consultation, greater than 50% of which was counseling/coordinating care for right lower lobe pulmonary AVM

## 2023-01-10 ENCOUNTER — Encounter: Payer: Self-pay | Admitting: Pulmonary Disease

## 2023-01-10 NOTE — Telephone Encounter (Signed)
Dr. Valeta Harms, please see pt's message regarding allergens. Thanks.

## 2023-01-10 NOTE — Progress Notes (Signed)
Subjective: Chief Complaint  Patient presents with   Bunions    Bilateral, left over right, requests orthotics    57 year old female presents the office today requesting new orthotics given her history of chronic fasciitis for surgery worn out.  She also discussed bunion surgery in her left foot and is causing pain.  He is on this for quite some time we discussed previously treatment think about surgery.  Also she is concerned about nail fungus on her nails.  No pain to the nails.  No open lesions.  Objective: AAO x3, NAD DP/PT pulses palpable bilaterally, CRT less than 3 seconds Moderate to severe bunion present in the left foot there is hypermobility present of the first ray.  Mild erythema on the medial first metatarsal head from irritation inside shoes.  Tenderness palpation on the bunion area. Nails are mildly discolored yellow, white discoloration.  No edema, erythema or signs of infection.  No open lesions. No significant pain or course of plantar fascia today. No pain with calf compression, swelling, warmth, erythema  Assessment: 57 year old female with plantar fasciitis, left foot bunion, onychomycosis  Plan: -All treatment options discussed with the patient including all alternatives, risks, complications.  -X-rays were obtained and reviewed bilaterally.  3 views of the feet were obtained.  Moderate increase in the 1st IMA.  No evidence of acute fracture. -Reviewed by me discussed with conservative as well as surgical options.  Given her hypermobility I discussed a Lapidus bunionectomy.  Discussed the distal osteotomy however this would likely have increased risk of recurrence.  We discussed the procedure as well as postop course.  She will consider her options. -She was measured for new orthotics today. -Discussed treatment options for nail fungus including oral, topical as well as alternative treatments.  At this time the patient wants to proceed with oral Lamisil.  We discussed  side effects of the medication and success rates.  We will check a CBC and LFT prior to starting the medication.  Once I receive the results of this I will then call the medication in.  -Patient encouraged to call the office with any questions, concerns, change in symptoms.   Trula Slade DPM

## 2023-01-11 ENCOUNTER — Ambulatory Visit
Admission: RE | Admit: 2023-01-11 | Discharge: 2023-01-11 | Disposition: A | Discharge status: Home / Self Care | Payer: BC Managed Care – PPO | Source: Ambulatory Visit | Attending: Interventional Radiology | Admitting: Interventional Radiology

## 2023-01-11 ENCOUNTER — Telehealth (HOSPITAL_COMMUNITY): Payer: Self-pay | Admitting: Interventional Radiology

## 2023-01-11 DIAGNOSIS — Q2730 Arteriovenous malformation, site unspecified: Secondary | ICD-10-CM | POA: Diagnosis not present

## 2023-01-11 DIAGNOSIS — Q2572 Congenital pulmonary arteriovenous malformation: Secondary | ICD-10-CM | POA: Diagnosis not present

## 2023-01-11 DIAGNOSIS — I898 Other specified noninfective disorders of lymphatic vessels and lymph nodes: Secondary | ICD-10-CM | POA: Diagnosis not present

## 2023-01-11 DIAGNOSIS — Z01818 Encounter for other preprocedural examination: Secondary | ICD-10-CM | POA: Diagnosis not present

## 2023-01-11 DIAGNOSIS — J849 Interstitial pulmonary disease, unspecified: Secondary | ICD-10-CM

## 2023-01-11 HISTORY — PX: IR RADIOLOGIST EVAL & MGMT: IMG5224

## 2023-01-11 MED ORDER — IOPAMIDOL (ISOVUE-370) INJECTION 76%
75.0000 mL | Freq: Once | INTRAVENOUS | Status: AC | PRN
  Administered 2023-01-11: 75 mL via INTRAVENOUS

## 2023-01-11 NOTE — Telephone Encounter (Signed)
Interventional Radiology Telephone Note  I reviewed Shannon Mann's CTA planning study and then spoke to her and her husband, Shannon Mann, via telephone.  I explained that her pAVM is small, complex and has a single afferent pulmonary artery measuring up to 4 mm in diameter which meets criteria for endovascular treatment.   I was able to answer all of her questions and she would like to proceed with treatment.    - Please proceed with scheduling her for pulmonary AVM embolization to be performed by me at [facility] with general anesthesia.   - We will need the rep for the Medtr[employer] MVP device.  We will use an MVP-5  Thank you!  Signed,  Criselda Peaches, MD

## 2023-01-14 DIAGNOSIS — D120 Benign neoplasm of cecum: Secondary | ICD-10-CM | POA: Diagnosis not present

## 2023-01-14 DIAGNOSIS — D122 Benign neoplasm of ascending colon: Secondary | ICD-10-CM | POA: Diagnosis not present

## 2023-01-14 DIAGNOSIS — K5730 Diverticulosis of large intestine without perforation or abscess without bleeding: Secondary | ICD-10-CM | POA: Diagnosis not present

## 2023-01-14 DIAGNOSIS — Z1211 Encounter for screening for malignant neoplasm of colon: Secondary | ICD-10-CM | POA: Diagnosis not present

## 2023-01-14 DIAGNOSIS — K644 Residual hemorrhoidal skin tags: Secondary | ICD-10-CM | POA: Diagnosis not present

## 2023-01-24 ENCOUNTER — Other Ambulatory Visit (HOSPITAL_COMMUNITY): Payer: Self-pay | Admitting: Interventional Radiology

## 2023-01-24 DIAGNOSIS — Q2572 Congenital pulmonary arteriovenous malformation: Secondary | ICD-10-CM

## 2023-02-05 ENCOUNTER — Telehealth (HOSPITAL_COMMUNITY): Payer: Self-pay | Admitting: Radiology

## 2023-02-05 ENCOUNTER — Encounter (HOSPITAL_COMMUNITY): Payer: Self-pay | Admitting: Interventional Radiology

## 2023-02-05 NOTE — Telephone Encounter (Signed)
Called pt, left VM for her to call me back to schedule pAVM embolization with Dr. Laurence Ferrari on 02/22/23. JM

## 2023-02-20 ENCOUNTER — Encounter (HOSPITAL_COMMUNITY): Payer: Self-pay | Admitting: *Deleted

## 2023-02-20 ENCOUNTER — Other Ambulatory Visit: Payer: Self-pay

## 2023-02-20 NOTE — Pre-Procedure Instructions (Signed)
PCP - Dr.Robert Production assistant, radio - pt denies Pulmonologist-Dr.Icard  PPM/ICD - pt denies  EKG - pt denies Stress Test - pt denies ECHO - 2003 prior to chemo-normal exam per pt Cardiac Cath - pt denies  Sleep Study/CPAP - pt denes  Diabetic- pt denies  Last dose of GLP1 agonist-  pt denies GLP1 instructions: n/a  Blood Thinner Instructions:pt denies Aspirin Instructions:pt denies  ERAS Protcol - NPO after midnight   COVID TEST- n/a   Anesthesia review: NO   Patient verbally denies any shortness of breath, fever, cough and chest pain during phone call.     -------------  SDW INSTRUCTIONS given:   Your procedure is scheduled on 02/22/23.             Report to [facility] Main Entrance "A" at  6:30  A.M., and check in at the Admitting office.             Call this number if you have problems the morning of surgery:             [phone]               Remember:             Do not eat or drink after midnight the night before your surgery                          Take these medicines the morning of surgery with A SIP OF WATER none  albuterol (VENTOLIN HFA) 108 (90 Base) MCG/ACT inhaler -bring with you  SYMBICORT    As of today, STOP taking any Aspirin (unless otherwise instructed by your surgeon) Aleve, Naproxen, Ibuprofen, Motrin, Advil, Goody's, BC's, all herbal medications, fish oil, and all vitamins.                       Do not wear jewelry, make up, or nail polish            Do not wear lotions, powders, perfumes/colognes, or deodorant.            Do not shave 48 hours prior to surgery.  Men may shave face and neck.            Do not bring valuables to the hospital.            [facility] is not responsible for any belongings or valuables.   Do NOT Smoke (Tobacco/Vaping) 24 hours prior to your procedure If you use a CPAP at night, you may bring all equipment for your overnight stay.   Contacts, glasses, dentures or partials may not be worn into surgery.       For patients admitted to the hospital, discharge time will be determined by your treatment team.   Patients discharged the day of surgery will not be allowed to drive home, and someone needs to stay with them for 24 hours.     Special instructions:   - Preparing For Surgery  Oral Hygiene is also important to reduce your risk of infection.  Remember - BRUSH YOUR TEETH THE MORNING OF SURGERY WITH YOUR REGULAR TOOTHPASTE   Before surgery, you can play an important role. Because skin is not sterile, your skin needs to be as free of germs as possible. You can reduce the number of germs on your skin by washing with Dial (or any antibacterial) Soap before surgery.    Please do  not use if you have an allergy to antibacterial soaps. If your skin becomes reddened/irritated stop using the Antibacterial Soap  Do not shave (including legs and underarms) for at least 48 hours prior to surgery. It is OK to shave your face.   Please follow these instructions carefully.              Shower the NIGHT BEFORE SURGERY and the MORNING OF SURGERY with (DIAL) Antibacterial Soap. Wash your body and hair with your normal shampoo/soap then rinse. Using a clean wash cloth wash from your neck down using the antibacterial soap. Wash thoroughly, paying special attention to the area where your surgery will be performed. Thoroughly rinse your body with warm water from the neck down.   Pat yourself dry with a CLEAN TOWEL.   Wear CLEAN PAJAMAS to bed the night before surgery.   Place CLEAN SHEETS on your bed the night of your surgery and DO NOT SLEEP WITH PETS.     Day of Surgery: Please shower morning of surgery using antibacterial soap Wear Clean/Comfortable clothing the morning of surgery Do not apply any deodorants/lotions.   Remember to brush your teeth WITH YOUR REGULAR TOOTHPASTE.   Questions were answered. Patient verbalized understanding of instructions.

## 2023-02-21 ENCOUNTER — Other Ambulatory Visit: Payer: Self-pay | Admitting: Internal Medicine

## 2023-02-21 DIAGNOSIS — Q2730 Arteriovenous malformation, site unspecified: Secondary | ICD-10-CM

## 2023-02-21 NOTE — Anesthesia Preprocedure Evaluation (Addendum)
Anesthesia Evaluation  Patient identified by MRN, date of birth, ID band Patient awake    Reviewed: Allergy & Precautions, NPO status , Patient's Chart, lab work & pertinent test results  History of Anesthesia Complications Negative for: history of anesthetic complications  Airway Mallampati: I  TM Distance: >3 FB Neck ROM: Full    Dental  (+) Dental Advisory Given   Pulmonary shortness of breath, COPD,  COPD inhaler Pulm AVM H/o Bleomycin and XRT to chest for Hodgkin's: small areas of pulm fibrosis Bilat     breath sounds clear to auscultation       Cardiovascular negative cardio ROS  Rhythm:Regular Rate:Normal     Neuro/Psych negative neurological ROS     GI/Hepatic negative GI ROS, Neg liver ROS,,,  Endo/Other  negative endocrine ROS    Renal/GU negative Renal ROS     Musculoskeletal   Abdominal   Peds  Hematology H/o Hodgkin's: Chemo, XRT   Anesthesia Other Findings   Reproductive/Obstetrics                              Anesthesia Physical Anesthesia Plan  ASA: 3  Anesthesia Plan: General   Post-op Pain Management: Tylenol PO (pre-op)*   Induction: Intravenous  PONV Risk Score and Plan: 3 and Ondansetron, Dexamethasone and Scopolamine patch - Pre-op  Airway Management Planned: Oral ETT  Additional Equipment: Arterial line  Intra-op Plan:   Post-operative Plan: Extubation in OR  Informed Consent: I have reviewed the patients History and Physical, chart, labs and discussed the procedure including the risks, benefits and alternatives for the proposed anesthesia with the patient or authorized representative who has indicated his/her understanding and acceptance.     Dental advisory given  Plan Discussed with: CRNA and Surgeon  Anesthesia Plan Comments:          Anesthesia Quick Evaluation

## 2023-02-22 ENCOUNTER — Ambulatory Visit (HOSPITAL_COMMUNITY)
Admission: RE | Admit: 2023-02-22 | Discharge: 2023-02-22 | Disposition: A | Discharge status: Home / Self Care | Payer: BC Managed Care – PPO | Source: Ambulatory Visit | Attending: Interventional Radiology | Admitting: Interventional Radiology

## 2023-02-22 ENCOUNTER — Encounter (HOSPITAL_COMMUNITY): Payer: Self-pay | Admitting: Anesthesiology

## 2023-02-22 ENCOUNTER — Encounter (HOSPITAL_COMMUNITY)
Admission: RE | Disposition: A | Discharge status: Home / Self Care | Payer: Self-pay | Source: Home / Self Care | Attending: Interventional Radiology

## 2023-02-22 ENCOUNTER — Ambulatory Visit (HOSPITAL_COMMUNITY): Payer: BC Managed Care – PPO | Admitting: Anesthesiology

## 2023-02-22 ENCOUNTER — Other Ambulatory Visit: Payer: Self-pay

## 2023-02-22 ENCOUNTER — Ambulatory Visit (HOSPITAL_COMMUNITY)
Admission: RE | Admit: 2023-02-22 | Discharge: 2023-02-22 | Disposition: A | Discharge status: Home / Self Care | Payer: BC Managed Care – PPO | Attending: Interventional Radiology | Admitting: Interventional Radiology

## 2023-02-22 ENCOUNTER — Encounter (HOSPITAL_COMMUNITY): Payer: Self-pay

## 2023-02-22 DIAGNOSIS — Z08 Encounter for follow-up examination after completed treatment for malignant neoplasm: Secondary | ICD-10-CM | POA: Diagnosis not present

## 2023-02-22 DIAGNOSIS — Q2572 Congenital pulmonary arteriovenous malformation: Secondary | ICD-10-CM

## 2023-02-22 DIAGNOSIS — Q2730 Arteriovenous malformation, site unspecified: Secondary | ICD-10-CM | POA: Diagnosis not present

## 2023-02-22 DIAGNOSIS — Z8571 Personal history of Hodgkin lymphoma: Secondary | ICD-10-CM | POA: Diagnosis not present

## 2023-02-22 DIAGNOSIS — J449 Chronic obstructive pulmonary disease, unspecified: Secondary | ICD-10-CM | POA: Diagnosis not present

## 2023-02-22 HISTORY — PX: RADIOLOGY WITH ANESTHESIA: SHX6223

## 2023-02-22 HISTORY — PX: IR EMBO ARTERIAL NOT HEMORR HEMANG INC GUIDE ROADMAPPING: IMG5448

## 2023-02-22 HISTORY — PX: IR US GUIDE VASC ACCESS RIGHT: IMG2390

## 2023-02-22 HISTORY — PX: IR ANGIOGRAM PULMONARY RIGHT SELECTIVE: IMG663

## 2023-02-22 LAB — CBC WITH DIFFERENTIAL/PLATELET
Abs Immature Granulocytes: 0.02 10*3/uL (ref 0.00–0.07)
Basophils Absolute: 0.1 10*3/uL (ref 0.0–0.1)
Basophils Relative: 1 %
Eosinophils Absolute: 0.3 10*3/uL (ref 0.0–0.5)
Eosinophils Relative: 5 %
HCT: 39.1 % (ref 36.0–46.0)
Hemoglobin: 13.4 g/dL (ref 12.0–15.0)
Immature Granulocytes: 0 %
Lymphocytes Relative: 22 %
Lymphs Abs: 1.4 10*3/uL (ref 0.7–4.0)
MCH: 30.5 pg (ref 26.0–34.0)
MCHC: 34.3 g/dL (ref 30.0–36.0)
MCV: 89.1 fL (ref 80.0–100.0)
Monocytes Absolute: 0.5 10*3/uL (ref 0.1–1.0)
Monocytes Relative: 7 %
Neutro Abs: 4.2 10*3/uL (ref 1.7–7.7)
Neutrophils Relative %: 65 %
Platelets: 333 10*3/uL (ref 150–400)
RBC: 4.39 MIL/uL (ref 3.87–5.11)
RDW: 12.2 % (ref 11.5–15.5)
WBC: 6.4 10*3/uL (ref 4.0–10.5)
nRBC: 0 % (ref 0.0–0.2)

## 2023-02-22 LAB — BASIC METABOLIC PANEL
Anion gap: 9 (ref 5–15)
BUN: 8 mg/dL (ref 6–20)
CO2: 23 mmol/L (ref 22–32)
Calcium: 9.3 mg/dL (ref 8.9–10.3)
Chloride: 107 mmol/L (ref 98–111)
Creatinine, Ser: 0.86 mg/dL (ref 0.44–1.00)
GFR, Estimated: >60 mL/min (ref >60)
Glucose, Bld: 102 mg/dL — ABNORMAL HIGH (ref 70–99)
Potassium: 3.7 mmol/L (ref 3.5–5.1)
Sodium: 139 mmol/L (ref 135–145)

## 2023-02-22 LAB — TYPE AND SCREEN
ABO/RH(D): A POS
Antibody Screen: NEGATIVE

## 2023-02-22 LAB — POCT PREGNANCY, URINE: Preg Test, Ur: NEGATIVE

## 2023-02-22 LAB — PROTIME-INR
INR: 0.9 (ref 0.8–1.2)
Prothrombin Time: 12.1 seconds (ref 11.4–15.2)

## 2023-02-22 LAB — ABO/RH: ABO/RH(D): A POS

## 2023-02-22 SURGERY — IR WITH ANESTHESIA
Anesthesia: General

## 2023-02-22 MED ORDER — PROMETHAZINE HCL 25 MG/ML IJ SOLN: 6.2500 mg | INTRAMUSCULAR | Status: DC | PRN

## 2023-02-22 MED ORDER — MIDAZOLAM HCL 5 MG/5ML IJ SOLN
INTRAMUSCULAR | Status: DC | PRN
  Administered 2023-02-22: 2 mg via INTRAVENOUS

## 2023-02-22 MED ORDER — SUGAMMADEX SODIUM 200 MG/2ML IV SOLN
INTRAVENOUS | Status: DC | PRN
  Administered 2023-02-22: 200 mg via INTRAVENOUS

## 2023-02-22 MED ORDER — IOHEXOL 300 MG/ML  SOLN
150.0000 mL | Freq: Once | INTRAMUSCULAR | Status: AC | PRN
  Administered 2023-02-22: 50 mL via INTRA_ARTERIAL

## 2023-02-22 MED ORDER — ONDANSETRON HCL 4 MG/2ML IJ SOLN
INTRAMUSCULAR | Status: DC | PRN
  Administered 2023-02-22: 4 mg via INTRAVENOUS

## 2023-02-22 MED ORDER — ORAL CARE MOUTH RINSE: 15.0000 mL | Freq: Once | OROMUCOSAL | Status: AC

## 2023-02-22 MED ORDER — MIDAZOLAM HCL 2 MG/2ML IJ SOLN: 0.5000 mg | Freq: Once | INTRAMUSCULAR | Status: DC | PRN

## 2023-02-22 MED ORDER — DEXAMETHASONE SODIUM PHOSPHATE 10 MG/ML IJ SOLN
INTRAMUSCULAR | Status: DC | PRN
  Administered 2023-02-22: 10 mg via INTRAVENOUS

## 2023-02-22 MED ORDER — PROPOFOL 10 MG/ML IV BOLUS
INTRAVENOUS | Status: DC | PRN
  Administered 2023-02-22: 200 mg via INTRAVENOUS

## 2023-02-22 MED ORDER — SODIUM CHLORIDE 0.9 % IV SOLN: INTRAVENOUS | Status: DC

## 2023-02-22 MED ORDER — IOHEXOL 300 MG/ML  SOLN: 150.0000 mL | Freq: Once | INTRAMUSCULAR | Status: DC | PRN

## 2023-02-22 MED ORDER — EPHEDRINE SULFATE-NACL 50-0.9 MG/10ML-% IV SOSY
PREFILLED_SYRINGE | INTRAVENOUS | Status: DC | PRN
  Administered 2023-02-22: 5 mg via INTRAVENOUS

## 2023-02-22 MED ORDER — ROCURONIUM BROMIDE 10 MG/ML (PF) SYRINGE
PREFILLED_SYRINGE | INTRAVENOUS | Status: DC | PRN
  Administered 2023-02-22: 60 mg via INTRAVENOUS

## 2023-02-22 MED ORDER — CHLORHEXIDINE GLUCONATE 0.12 % MT SOLN
15.0000 mL | Freq: Once | OROMUCOSAL | Status: AC
  Administered 2023-02-22: 15 mL via OROMUCOSAL
  Filled 2023-02-22: qty 15

## 2023-02-22 MED ORDER — FENTANYL CITRATE (PF) 100 MCG/2ML IJ SOLN: 25.0000 ug | INTRAMUSCULAR | Status: DC | PRN

## 2023-02-22 MED ORDER — LIDOCAINE 2% (20 MG/ML) 5 ML SYRINGE
INTRAMUSCULAR | Status: DC | PRN
  Administered 2023-02-22: 20 mg via INTRAVENOUS

## 2023-02-22 MED ORDER — MEPERIDINE HCL 25 MG/ML IJ SOLN: 6.2500 mg | INTRAMUSCULAR | Status: DC | PRN

## 2023-02-22 MED ORDER — ESMOLOL HCL 100 MG/10ML IV SOLN
INTRAVENOUS | Status: DC | PRN
  Administered 2023-02-22 (×2): 10 mg via INTRAVENOUS

## 2023-02-22 MED ORDER — ACETAMINOPHEN 500 MG PO TABS
1000.0000 mg | ORAL_TABLET | Freq: Once | ORAL | Status: AC
  Administered 2023-02-22: 1000 mg via ORAL
  Filled 2023-02-22: qty 2

## 2023-02-22 MED ORDER — PHENYLEPHRINE HCL-NACL 20-0.9 MG/250ML-% IV SOLN
INTRAVENOUS | Status: DC | PRN
  Administered 2023-02-22: 10 ug/min via INTRAVENOUS

## 2023-02-22 MED ORDER — OXYCODONE HCL 5 MG PO TABS: 5.0000 mg | ORAL_TABLET | Freq: Once | ORAL | Status: DC | PRN

## 2023-02-22 MED ORDER — OXYCODONE HCL 5 MG/5ML PO SOLN: 5.0000 mg | Freq: Once | ORAL | Status: DC | PRN

## 2023-02-22 MED ORDER — FENTANYL CITRATE (PF) 250 MCG/5ML IJ SOLN
INTRAMUSCULAR | Status: DC | PRN
  Administered 2023-02-22: 25 ug via INTRAVENOUS

## 2023-02-22 MED ORDER — LACTATED RINGERS IV SOLN: INTRAVENOUS | Status: DC | PRN

## 2023-02-22 MED ORDER — SCOPOLAMINE 1 MG/3DAYS TD PT72
1.0000 | MEDICATED_PATCH | TRANSDERMAL | Status: AC
  Administered 2023-02-22: 1.5 mg via TRANSDERMAL
  Filled 2023-02-22: qty 1

## 2023-02-22 MED ORDER — PHENYLEPHRINE 80 MCG/ML (10ML) SYRINGE FOR IV PUSH (FOR BLOOD PRESSURE SUPPORT)
PREFILLED_SYRINGE | INTRAVENOUS | Status: DC | PRN
  Administered 2023-02-22 (×2): 80 ug via INTRAVENOUS

## 2023-02-22 MED ORDER — HEPARIN SODIUM (PORCINE) 1000 UNIT/ML IJ SOLN
INTRAMUSCULAR | Status: DC | PRN
  Administered 2023-02-22: 5000 [IU] via INTRAVENOUS

## 2023-02-22 NOTE — Anesthesia Postprocedure Evaluation (Signed)
Anesthesia Post Note  Patient: Shannon Mann  Procedure(s) Performed: Pulmonary AVM embolization     Patient location during evaluation: PACU Anesthesia Type: General Level of consciousness: awake and alert, patient cooperative and oriented Pain management: pain level controlled Vital Signs Assessment: post-procedure vital signs reviewed and stable Respiratory status: spontaneous breathing, nonlabored ventilation and respiratory function stable Cardiovascular status: blood pressure returned to baseline and stable Postop Assessment: no apparent nausea or vomiting Anesthetic complications: no   No notable events documented.  Last Vitals:  Vitals:   02/22/23 1100 02/22/23 1115  BP: 107/73 108/66  Pulse: 75 73  Resp: 17 15  Temp:    SpO2: 96% 96%    Last Pain:  Vitals:   02/22/23 1115  TempSrc:   PainSc: 0-No pain                 Aslee Such,E. Davis Ambrosini

## 2023-02-22 NOTE — Sedation Documentation (Signed)
Per Dr. Laurence Ferrari, pt not to receive foley catheter prior to procedure.  Pt informed of decision and acknowledges to proceed with such and okay with same.

## 2023-02-22 NOTE — Anesthesia Procedure Notes (Signed)
Procedure Name: Intubation Date/Time: 02/22/2023 9:07 AM  Performed by: Kyung Rudd, CRNAPre-anesthesia Checklist: Patient identified, Emergency Drugs available, Suction available and Patient being monitored Patient Re-evaluated:Patient Re-evaluated prior to induction Oxygen Delivery Method: Circle system utilized Preoxygenation: Pre-oxygenation with 100% oxygen Induction Type: IV induction Ventilation: Mask ventilation without difficulty Laryngoscope Size: Mac and 3 Grade View: Grade I Tube type: Oral Tube size: 7.0 mm Number of attempts: 1 Airway Equipment and Method: Stylet Placement Confirmation: ETT inserted through vocal cords under direct vision, positive ETCO2 and breath sounds checked- equal and bilateral Secured at: 20 cm Tube secured with: Tape Dental Injury: Teeth and Oropharynx as per pre-operative assessment

## 2023-02-22 NOTE — Transfer of Care (Signed)
Immediate Anesthesia Transfer of Care Note  Patient: Shannon Mann  Procedure(s) Performed: Pulmonary AVM embolization  Patient Location: PACU  Anesthesia Type:General  Level of Consciousness: awake, alert , and oriented  Airway & Oxygen Therapy: Patient Spontanous Breathing  Post-op Assessment: Report given to RN, Post -op Vital signs reviewed and stable, and Patient moving all extremities X 4  Post vital signs: Reviewed and stable  Last Vitals:  Vitals Value Taken Time  BP 109/73 02/22/23 1045  Temp    Pulse 78 02/22/23 1048  Resp 17 02/22/23 1048  SpO2 94 % 02/22/23 1048  Vitals shown include unvalidated device data.  Last Pain:  Vitals:   02/22/23 1044  TempSrc:   PainSc: 0-No pain         Complications: No notable events documented.

## 2023-02-22 NOTE — H&P (Deleted)
  The note originally documented on this encounter has been moved the the encounter in which it belongs.  

## 2023-02-22 NOTE — Procedures (Signed)
Interventional Radiology Procedure Note  Procedure: Pulmonary angiogram with MVP embolization of RLL pulmonary AVM.   Complications: None  Estimated Blood Loss: None  Recommendations: - Bedrest x 2 hrs - DC home   Signed,  Criselda Peaches, MD

## 2023-02-22 NOTE — H&P (Signed)
Chief Complaint: Patient was seen in consultation today for pulmonary AVM  Referring Physician(s): June Leap, MD  Supervising Physician: Jacqulynn Cadet  Patient Status: [facility] - Out-pt  History of Present Illness: Shannon Mann is a 57 y.o. female with a past medical history significant for Hodgkin's lymphoma s/p systemic therapy and radiation (2005) and RLL pulmonary arteriovenous malformation diagnosed in 2008 who presents today for pulmonary AVM embolization. Shannon Mann was seen in IR consultation with Dr. Laurence Ferrari on 01/08/23 -- please see this note for full details. Briefly, Shannon Mann was referred to pulmonology in 2022 for persistent cough, dyspnea on exertion, evaluation of pulmonary function post chemotherapy and work up of RLL pulmonary AVM. She was referred to IR for pulmonary AVM embolization and after thorough discussion with Dr. Laurence Ferrari she was approved for the procedure.   Past Medical History:  Diagnosis Date   Allergic rhinitis    Hodgkin disease ([place]) 12/18/2003   rx with bleo then xrt   Pulmonary arteriovenous aneurysm    RLL by ct chest 2009    Past Surgical History:  Procedure Laterality Date   IR RADIOLOGIST EVAL & MGMT  01/11/2023   LYMPH NODE BIOPSY  12/17/2001   PORTACATH PLACEMENT  2003   with Bone marrow biopsy    Allergies: Patient has no known allergies.  Medications: Prior to Admission medications   Medication Sig Start Date End Date Taking? Authorizing Provider  terbinafine (LAMISIL) 250 MG tablet Take 1 tablet (250 mg total) by mouth daily. 01/08/23   Trula Slade, DPM  albuterol (VENTOLIN HFA) 108 (90 Base) MCG/ACT inhaler Inhale 2 puffs into the lungs every 4 (four) hours as needed for wheezing or shortness of breath. 08/10/21   [provider]  Efinaconazole 10 % SOLN Apply 1 drop topically daily. Patient not taking: Reported on 02/19/2023 01/07/23   Trula Slade, DPM  fexofenadine (ALLEGRA) 180 MG tablet Take 1  tablet (180 mg total) by mouth daily. Patient not taking: Reported on 02/19/2023 12/26/22 03/26/23  Icard, Leory Plowman L, DO  fluticasone (FLONASE) 50 MCG/ACT nasal spray Place 2 sprays into both nostrils 2 (two) times daily. Patient not taking: Reported on 02/19/2023 12/26/22   June Leap L, DO  ibuprofen (ADVIL) 200 MG tablet Take 600 mg by mouth every 6 (six) hours as needed for moderate pain.    [provider]  levonorgestrel (MIRENA, 52 MG,) 20 MCG/DAY IUD Mirena 20 mcg/24 hours (8 yrs) 52 mg intrauterine device  Take by intrauterine route. 10/02/16   [provider]  Multiple Vitamin (MULTIVITAMIN WITH MINERALS) TABS tablet Take 1 tablet by mouth daily.    [provider]  Omega-3 Fatty Acids (FISH OIL PO) Take 1,280 mg by mouth daily.    [provider]  SYMBICORT 160-4.5 MCG/ACT inhaler Inhale 2 puffs into the lungs 2 (two) times daily as needed (shortness of breath). 12/04/22   [provider]     Family History  Problem Relation Age of Onset   Hyperlipidemia Mother    Emphysema Maternal Grandmother    Allergies Other        children   Heart disease Paternal Grandmother    Hodgkin's lymphoma Father     Social History   Socioeconomic History   Marital status: Married    Spouse name: Not on file   Number of children: 2   Years of education: Not on file   Highest education level: Not on file  Occupational History   Occupation:  stay at home mother    Employer: UNEMPLOYED  Tobacco Use   Smoking status: Never   Smokeless tobacco: Never  Vaping Use   Vaping Use: Never used  Substance and Sexual Activity   Alcohol use: Yes    Alcohol/week: 2.0 - 3.0 standard drinks of alcohol    Types: 2 - 3 Standard drinks or equivalent per week    Comment: social   Drug use: No   Sexual activity: Not on file  Other Topics Concern   Not on file  Social History Narrative   Not on file   Social Determinants of Health   Financial Resource  Strain: Not on file  Food Insecurity: Not on file  Transportation Needs: Not on file  Physical Activity: Not on file  Stress: Not on file  Social Connections: Not on file     Review of Systems: A 12 point ROS discussed and pertinent positives are indicated in the HPI above.  All other systems are negative.  Review of Systems  Constitutional:  Negative for chills and fever.  Respiratory:  Negative for cough and shortness of breath.   Cardiovascular:  Negative for chest pain.  Gastrointestinal:  Negative for abdominal pain, diarrhea, nausea and vomiting.  Musculoskeletal:  Negative for back pain.  Neurological:  Negative for dizziness and headaches.    Vital Signs: There were no vitals taken for this visit.  Physical Exam Vitals and nursing note reviewed.  Constitutional:      General: She is not in acute distress. HENT:     Head: Normocephalic.  Cardiovascular:     Rate and Rhythm: Normal rate and regular rhythm.  Pulmonary:     Effort: Pulmonary effort is normal.     Breath sounds: Normal breath sounds.  Abdominal:     General: There is no distension.     Palpations: Abdomen is soft.     Tenderness: There is no abdominal tenderness.  Skin:    General: Skin is warm and dry.  Neurological:     Mental Status: She is alert and oriented to person, place, and time.  Psychiatric:        Mood and Affect: Mood normal.        Behavior: Behavior normal.        Thought Content: Thought content normal.        Judgment: Judgment normal.      MD Evaluation Airway: WNL Heart: WNL Abdomen: WNL Chest/ Lungs: WNL ASA  Classification: Per MD or Designee   Imaging: No results found.  Labs:  CBC: Recent Labs    01/07/23 1620 02/22/23 0727  WBC 5.6 6.4  HGB 14.6 13.4  HCT 43.0 39.1  PLT 365 333    COAGS: Recent Labs    02/22/23 0727  INR 0.9    BMP: Recent Labs    02/22/23 0727  NA 139  K 3.7  CL 107  CO2 23  GLUCOSE 102*  BUN 8  CALCIUM 9.3   CREATININE 0.86  GFRNONAA >60    LIVER FUNCTION TESTS: Recent Labs    01/07/23 1620  BILITOT 0.3  AST 19  ALT 11  ALKPHOS 76  PROT 7.2  ALBUMIN 4.7    TUMOR MARKERS: No results for input(s): "AFPTM", "CEA", "CA199", "CHROMGRNA" in the last 8760 hours.  Assessment and Plan:  57 y/o F with history of Hodgkin's Lymphoma s/p chemotherapy and radiation (2005) noted to have RLL pulmonary AVM who presents today for pulmonary AVM embolization as previously  discussed with Dr. Laurence Ferrari.  The Risks and benefits of embolization were discussed with the patient including, but not limited to bleeding, infection, vascular injury, post operative pain, or contrast induced renal failure.  This procedure involves the use of X-rays and because of the nature of the planned procedure, it is possible that we will have prolonged use of X-ray fluoroscopy.  Potential radiation risks to you include (but are not limited to) the following: - A slightly elevated risk for cancer several years later in life. This risk is typically less than 0.5% percent. This risk is low in comparison to the normal incidence of human cancer, which is 33% for women and 50% for men according to the [employer]. - Radiation induced injury can include skin redness, resembling a rash, tissue breakdown / ulcers and hair loss (which can be temporary or permanent).   The likelihood of either of these occurring depends on the difficulty of the procedure and whether you are sensitive to radiation due to previous procedures, disease, or genetic conditions.   IF your procedure requires a prolonged use of radiation, you will be notified and given written instructions for further action.  It is your responsibility to monitor the irradiated area for the 2 weeks following the procedure and to notify your physician if you are concerned that you have suffered a radiation induced injury.    All of the patient's questions were  answered, patient is agreeable to proceed.  Consent signed and in chart.   Thank you for this interesting consult.  I greatly enjoyed meeting Shannon Mann and look forward to participating in their care.  A copy of this report was sent to the requesting provider on this date.  Electronically Signed: Joaquim Nam, PA-C 02/22/2023, 8:15 AM   I spent a total of 30 Minutes  in face to face in clinical consultation, greater than 50% of which was counseling/coordinating care for pulmonary AVM embolization.

## 2023-02-22 NOTE — Anesthesia Procedure Notes (Signed)
Arterial Line Insertion Start/End3/07/2023 8:15 AM, 02/22/2023 8:30 AM Performed by: Kyung Rudd, CRNA, CRNA  Patient location: Pre-op. Preanesthetic checklist: patient identified, IV checked, site marked, risks and benefits discussed, surgical consent, monitors and equipment checked, pre-op evaluation and timeout performed Lidocaine 1% used for infiltration Left, radial was placed Catheter size: 20 G Hand hygiene performed , maximum sterile barriers used  and Seldinger technique used Allen's test indicative of satisfactory collateral circulation Attempts: 1 Procedure performed without using ultrasound guided technique. Following insertion, Biopatch and dressing applied. Post procedure assessment: normal  Patient tolerated the procedure well with no immediate complications.

## 2023-02-23 ENCOUNTER — Encounter (HOSPITAL_COMMUNITY): Payer: Self-pay | Admitting: Interventional Radiology

## 2023-02-26 ENCOUNTER — Other Ambulatory Visit: Payer: Self-pay | Admitting: Interventional Radiology

## 2023-02-26 DIAGNOSIS — J849 Interstitial pulmonary disease, unspecified: Secondary | ICD-10-CM

## 2023-03-01 ENCOUNTER — Telehealth: Payer: Self-pay | Admitting: Podiatry

## 2023-03-01 NOTE — Telephone Encounter (Signed)
Left message on vm returning call to pick up orthotics     Balance is [phone].Eek

## 2023-03-20 ENCOUNTER — Other Ambulatory Visit: Payer: BC Managed Care – PPO

## 2023-03-26 ENCOUNTER — Other Ambulatory Visit: Payer: Self-pay | Admitting: Pulmonary Disease

## 2023-03-28 ENCOUNTER — Ambulatory Visit
Admission: RE | Admit: 2023-03-28 | Discharge: 2023-03-28 | Disposition: A | Discharge status: Home / Self Care | Payer: BC Managed Care – PPO | Source: Ambulatory Visit | Attending: Interventional Radiology | Admitting: Interventional Radiology

## 2023-03-28 DIAGNOSIS — Q282 Arteriovenous malformation of cerebral vessels: Secondary | ICD-10-CM | POA: Diagnosis not present

## 2023-03-28 DIAGNOSIS — J849 Interstitial pulmonary disease, unspecified: Secondary | ICD-10-CM

## 2023-03-28 HISTORY — PX: IR RADIOLOGIST EVAL & MGMT: IMG5224

## 2023-03-28 NOTE — Progress Notes (Signed)
Chief Complaint: Patient was consulted remotely today (TeleHealth) for pulmonary AVM post embolization at the request of Vicky Schleich K.    Referring Physician(s): Dr. Nida Boatman Icard  History of Present Illness: Shannon Mann is a 57 y.o. 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  The feeding artery has been slowly enlarging over time and measured nearly 4 mm which demonstrated an indication for treatment.  She subsequently underwent an elective pulmonary arteriogram and embolization which was performed on 02/22/2023.  The procedure went well and without complication.  She was discharged home the same day.  We spoke over the telephone today for her 1 month follow-up evaluation.  She has done very well since the procedure.  Her right groin puncture site is completely healed.  She has no pain, shortness of breath or other systemic symptoms.  She has returned to full activity including vigorous exercise.  Past Medical History:  Diagnosis Date   Allergic rhinitis    Hodgkin disease 12/18/2003   rx with bleo then xrt   Pulmonary arteriovenous aneurysm    RLL by ct chest 2009    Past Surgical History:  Procedure Laterality Date   IR ANGIOGRAM PULMONARY RIGHT SELECTIVE  02/22/2023   IR EMBO ARTERIAL NOT HEMORR HEMANG INC GUIDE ROADMAPPING  02/22/2023   IR RADIOLOGIST EVAL & MGMT  01/11/2023   IR RADIOLOGIST EVAL & MGMT  03/28/2023   IR US GUIDE VASC ACCESS RIGHT  02/22/2023   LYMPH NODE BIOPSY  12/17/2001   PORTACATH PLACEMENT  2003   with Bone marrow biopsy   RADIOLOGY WITH ANESTHESIA N/A 02/22/2023   Procedure: Pulmonary AVM embolization;  Surgeon: Sterling Big, MD;  Location: [facility];  Service: Radiology;  Laterality: N/A;    Allergies: Patient has no known allergies.  Medications: Prior to Admission medications   Medication Sig Start Date End Date Taking? Authorizing Provider  terbinafine (LAMISIL) 250 MG tablet Take 1 tablet  (250 mg total) by mouth daily. 01/08/23   Vivi Barrack, DPM  albuterol (VENTOLIN HFA) 108 (90 Base) MCG/ACT inhaler Inhale 2 puffs into the lungs every 4 (four) hours as needed for wheezing or shortness of breath. 08/10/21   [provider]  Efinaconazole 10 % SOLN Apply 1 drop topically daily. Patient not taking: Reported on 02/19/2023 01/07/23   Vivi Barrack, DPM  fexofenadine (ALLEGRA) 180 MG tablet TAKE 1 TABLET BY MOUTH EVERY DAY 03/26/23   Icard, Rachel Bo, DO  fluticasone (FLONASE) 50 MCG/ACT nasal spray Place 2 sprays into both nostrils 2 (two) times daily. Patient not taking: Reported on 02/19/2023 12/26/22   Audie Box L, DO  ibuprofen (ADVIL) 200 MG tablet Take 600 mg by mouth every 6 (six) hours as needed for moderate pain.    [provider]  levonorgestrel (MIRENA, 52 MG,) 20 MCG/DAY IUD Mirena 20 mcg/24 hours (8 yrs) 52 mg intrauterine device  Take by intrauterine route. 10/02/16   [provider]  Multiple Vitamin (MULTIVITAMIN WITH MINERALS) TABS tablet Take 1 tablet by mouth daily.    [provider]  Omega-3 Fatty Acids (FISH OIL PO) Take 1,280 mg by mouth daily.    [provider]  SYMBICORT 160-4.5 MCG/ACT inhaler Inhale 2 puffs into the lungs 2 (two) times daily as needed (shortness of breath). 12/04/22   [provider]     Family History  Problem Relation Age of Onset   Hyperlipidemia Mother    Emphysema Maternal  Grandmother    Allergies Other        children   Heart disease Paternal Grandmother    Hodgkin's lymphoma Father     Social History   Socioeconomic History   Marital status: Married    Spouse name: Not on file   Number of children: 2   Years of education: Not on file   Highest education level: Not on file  Occupational History   Occupation: stay at home mother    Employer: UNEMPLOYED  Tobacco Use   Smoking status: Never   Smokeless tobacco: Never  Vaping Use   Vaping Use: Never used   Substance and Sexual Activity   Alcohol use: Yes    Alcohol/week: 2.0 - 3.0 standard drinks of alcohol    Types: 2 - 3 Standard drinks or equivalent per week    Comment: social   Drug use: No   Sexual activity: Not on file  Other Topics Concern   Not on file  Social History Narrative   Not on file   Social Determinants of Health   Financial Resource Strain: Not on file  Food Insecurity: Not on file  Transportation Needs: Not on file  Physical Activity: Not on file  Stress: Not on file  Social Connections: Not on file    Review of Systems  Review of Systems: A 12 point ROS discussed and pertinent positives are indicated in the HPI above.  All other systems are negative.   Physical Exam No direct physical exam was performed (except for noted visual exam findings with Video Visits).    Vital Signs: There were no vitals taken for this visit.  Imaging: IR Radiologist Eval & Mgmt  Result Date: 03/28/2023 EXAM: ESTABLISHED PATIENT OFFICE VISIT CHIEF COMPLAINT: SEE EPIC NOTE HISTORY OF PRESENT ILLNESS: SEE EPIC NOTE REVIEW OF SYSTEMS: SEE EPIC NOTE PHYSICAL EXAMINATION: SEE EPIC NOTE ASSESSMENT AND PLAN: SEE EPIC NOTE Electronically Signed   By: Malachy MoanHeath  Krishay Faro M.D.   On: 03/28/2023 14:09    Labs:  CBC: Recent Labs    01/07/23 1620 02/22/23 0727  WBC 5.6 6.4  HGB 14.6 13.4  HCT 43.0 39.1  PLT 365 333    COAGS: Recent Labs    02/22/23 0727  INR 0.9    BMP: Recent Labs    02/22/23 0727  NA 139  K 3.7  CL 107  CO2 23  GLUCOSE 102*  BUN 8  CALCIUM 9.3  CREATININE 0.86  GFRNONAA >60    LIVER FUNCTION TESTS: Recent Labs    01/07/23 1620  BILITOT 0.3  AST 19  ALT 11  ALKPHOS 76  PROT 7.2  ALBUMIN 4.7    TUMOR MARKERS: No results for input(s): "AFPTM", "CEA", "CA199", "CHROMGRNA" in the last 8760 hours.  Assessment and Plan:  Extremely pleasant 57 year old female now 1 month status post microvascular plug embolization of right lower lobe  complex pulmonary AVM.  Her she is doing exceptionally well.  1.)  CTA of the chest (PE protocol) at 3 months postprocedure (June 2024) with accompanying clinic visit.    Electronically Signed: Sterling BigHeath K Lisseth Brazeau 03/28/2023, 3:34 PM   I spent a total of  10 Minutes in remote  clinical consultation, greater than 50% of which was counseling/coordinating care for small complex pulmonary AVM.    Visit type: Audio only (telephone). Audio (no video) only due to patient preference. [facility] for [facility], [address], [place], [place]. This visit type was conducted due to  national recommendations for restrictions regarding the COVID-19 Pandemic (e.g. social distancing).  This format is felt to be most appropriate for this patient at this time.  All issues noted in this document were discussed and addressed.

## 2023-04-02 ENCOUNTER — Ambulatory Visit (INDEPENDENT_AMBULATORY_CARE_PROVIDER_SITE_OTHER): Payer: BC Managed Care – PPO

## 2023-04-02 DIAGNOSIS — M722 Plantar fascial fibromatosis: Secondary | ICD-10-CM

## 2023-04-02 DIAGNOSIS — M21619 Bunion of unspecified foot: Secondary | ICD-10-CM

## 2023-04-02 NOTE — Progress Notes (Signed)
Patient presents to the office today with issues concerning their custom inserts. Patient states that her heel comes out of her shoe and the unload is needed for her bunion. Spoke with the lab and they are going to make them like her old pair, which was a dress orthotic with a heel punch and a unload for her bunion. Order number to mimic is [id number].  Will send orthotics to lab for adjustments.  Advised patient that she will receive a call from the office to come in for a fitting.

## 2023-04-08 ENCOUNTER — Ambulatory Visit: Payer: BC Managed Care – PPO | Admitting: Podiatry

## 2023-04-10 DIAGNOSIS — L821 Other seborrheic keratosis: Secondary | ICD-10-CM | POA: Diagnosis not present

## 2023-04-10 DIAGNOSIS — D2262 Melanocytic nevi of left upper limb, including shoulder: Secondary | ICD-10-CM | POA: Diagnosis not present

## 2023-04-10 DIAGNOSIS — D2261 Melanocytic nevi of right upper limb, including shoulder: Secondary | ICD-10-CM | POA: Diagnosis not present

## 2023-04-10 DIAGNOSIS — D2271 Melanocytic nevi of right lower limb, including hip: Secondary | ICD-10-CM | POA: Diagnosis not present

## 2023-04-16 ENCOUNTER — Telehealth: Payer: Self-pay | Admitting: Podiatry

## 2023-04-16 NOTE — Telephone Encounter (Signed)
Lmom to call back to schedule picking up orthotics  

## 2023-04-23 NOTE — Telephone Encounter (Signed)
Lm returning pts call to schedule picking up orthotics

## 2023-05-23 ENCOUNTER — Ambulatory Visit: Payer: BC Managed Care – PPO | Admitting: Podiatry

## 2023-05-23 DIAGNOSIS — M722 Plantar fascial fibromatosis: Secondary | ICD-10-CM

## 2023-05-23 NOTE — Progress Notes (Signed)
Patient presents today to pick up custom orthotics   Patient was dispensed 1 pair of custom orthotics, had to trim 1/8 of inch off of the orthotics as they were to long and buckling in shoe. After trimming orthotic patient said they were a perfect fit, she will follow break in instructions and call us if there is any problems. Fit was satisfactory. Instructions for break-in and wear was reviewed and a copy was given to the patient.   Re-appointment for regularly scheduled diabetic foot care visits or if they should experience any trouble with the shoes or insoles.

## 2023-06-10 DIAGNOSIS — Z1231 Encounter for screening mammogram for malignant neoplasm of breast: Secondary | ICD-10-CM | POA: Diagnosis not present

## 2023-06-10 DIAGNOSIS — Z124 Encounter for screening for malignant neoplasm of cervix: Secondary | ICD-10-CM | POA: Diagnosis not present

## 2023-06-10 DIAGNOSIS — Z01419 Encounter for gynecological examination (general) (routine) without abnormal findings: Secondary | ICD-10-CM | POA: Diagnosis not present

## 2023-06-12 ENCOUNTER — Other Ambulatory Visit: Payer: Self-pay | Admitting: Obstetrics and Gynecology

## 2023-06-12 DIAGNOSIS — N951 Menopausal and female climacteric states: Secondary | ICD-10-CM

## 2023-07-03 DIAGNOSIS — N951 Menopausal and female climacteric states: Secondary | ICD-10-CM | POA: Diagnosis not present

## 2023-08-05 ENCOUNTER — Other Ambulatory Visit: Payer: Self-pay | Admitting: Interventional Radiology

## 2023-08-05 DIAGNOSIS — J849 Interstitial pulmonary disease, unspecified: Secondary | ICD-10-CM

## 2023-08-06 ENCOUNTER — Ambulatory Visit
Admission: RE | Admit: 2023-08-06 | Discharge: 2023-08-06 | Disposition: A | Discharge status: Home / Self Care | Payer: BC Managed Care – PPO | Source: Ambulatory Visit | Attending: Interventional Radiology | Admitting: Interventional Radiology

## 2023-08-06 DIAGNOSIS — R911 Solitary pulmonary nodule: Secondary | ICD-10-CM | POA: Diagnosis not present

## 2023-08-06 DIAGNOSIS — J849 Interstitial pulmonary disease, unspecified: Secondary | ICD-10-CM

## 2023-08-06 MED ORDER — IOPAMIDOL (ISOVUE-370) INJECTION 76%
75.0000 mL | Freq: Once | INTRAVENOUS | Status: AC | PRN
  Administered 2023-08-06: 75 mL via INTRAVENOUS

## 2023-08-13 ENCOUNTER — Ambulatory Visit
Admission: RE | Admit: 2023-08-13 | Discharge: 2023-08-13 | Disposition: A | Discharge status: Home / Self Care | Payer: BC Managed Care – PPO | Source: Ambulatory Visit | Attending: Interventional Radiology | Admitting: Interventional Radiology

## 2023-08-13 DIAGNOSIS — J849 Interstitial pulmonary disease, unspecified: Secondary | ICD-10-CM

## 2023-08-13 DIAGNOSIS — Q2739 Arteriovenous malformation, other site: Secondary | ICD-10-CM | POA: Diagnosis not present

## 2023-08-13 HISTORY — PX: IR RADIOLOGIST EVAL & MGMT: IMG5224

## 2023-08-13 NOTE — Progress Notes (Signed)
Chief Complaint: Patient was consulted remotely today (TeleHealth) for pulmonary AVM at the request of Aarini Slee K.    Referring Physician(s): Dr. Nida Boatman Icard   History of Present Illness: Shannon Mann is a 57 y.o. 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  The feeding artery has been slowly enlarging over time and measured nearly 4 mm which demonstrated an indication for treatment.  She subsequently underwent an elective pulmonary arteriogram and embolization which was performed on 02/22/2023.  The procedure went well and without complication.  She was discharged home the same day.    Her follow-up CT arteriogram was performed on 08/06/2023.  There has been remarkable interval resolution of the previously identified complex right lower lobe pulmonary AVM.  The AVM is completely devascularized and exists only as a small linear focus of scarring.  On the CT scan, the microvascular plug (MVP) device was mistakenly referred to as a new 6 mm pulmonary nodule.  However, there is a small focus of new groundglass attenuation airspace opacity more peripherally in the right lower lobe.  In contrast, a previously identified groundglass attenuation nodule is resolved on the current examination.  Fluctuating groundglass attenuation nodules likely reflect a very mild underlying infectious or inflammatory process, perhaps due to bronchial inflammation.  Shannon Mann continues to do well and has no clinical symptoms at this time.  Past Medical History:  Diagnosis Date   Allergic rhinitis    Hodgkin disease (HCC) 12/18/2003   rx with bleo then xrt   Pulmonary arteriovenous aneurysm    RLL by ct chest 2009    Past Surgical History:  Procedure Laterality Date   IR ANGIOGRAM PULMONARY RIGHT SELECTIVE  02/22/2023   IR EMBO ARTERIAL NOT HEMORR HEMANG INC GUIDE ROADMAPPING  02/22/2023   IR RADIOLOGIST EVAL & MGMT  01/11/2023   IR RADIOLOGIST EVAL & MGMT   03/28/2023   IR RADIOLOGIST EVAL & MGMT  08/13/2023   IR US GUIDE VASC ACCESS RIGHT  02/22/2023   LYMPH NODE BIOPSY  12/17/2001   PORTACATH PLACEMENT  2003   with Bone marrow biopsy   RADIOLOGY WITH ANESTHESIA N/A 02/22/2023   Procedure: Pulmonary AVM embolization;  Surgeon: Sterling Big, MD;  Location: [facility] OR;  Service: Radiology;  Laterality: N/A;    Allergies: Patient has no known allergies.  Medications: Prior to Admission medications   Medication Sig Start Date End Date Taking? Authorizing Provider  terbinafine (LAMISIL) 250 MG tablet Take 1 tablet (250 mg total) by mouth daily. 01/08/23   Vivi Barrack, DPM  albuterol (VENTOLIN HFA) 108 (90 Base) MCG/ACT inhaler Inhale 2 puffs into the lungs every 4 (four) hours as needed for wheezing or shortness of breath. 08/10/21   [provider]  Efinaconazole 10 % SOLN Apply 1 drop topically daily. Patient not taking: Reported on 02/19/2023 01/07/23   Vivi Barrack, DPM  fexofenadine (ALLEGRA) 180 MG tablet TAKE 1 TABLET BY MOUTH EVERY DAY 03/26/23   Icard, Rachel Bo, DO  fluticasone (FLONASE) 50 MCG/ACT nasal spray Place 2 sprays into both nostrils 2 (two) times daily. Patient not taking: Reported on 02/19/2023 12/26/22   Audie Box L, DO  ibuprofen (ADVIL) 200 MG tablet Take 600 mg by mouth every 6 (six) hours as needed for moderate pain.    [provider]  levonorgestrel (MIRENA, 52 MG,) 20 MCG/DAY IUD Mirena 20 mcg/24 hours (8 yrs) 52 mg intrauterine device  Take by intrauterine route. 10/02/16  [provider]  Multiple Vitamin (MULTIVITAMIN WITH MINERALS) TABS tablet Take 1 tablet by mouth daily.    [provider]  Omega-3 Fatty Acids (FISH OIL PO) Take 1,280 mg by mouth daily.    [provider]  SYMBICORT 160-4.5 MCG/ACT inhaler Inhale 2 puffs into the lungs 2 (two) times daily as needed (shortness of breath). 12/04/22   [provider]     Family History  Problem  Relation Age of Onset   Hyperlipidemia Mother    Emphysema Maternal Grandmother    Allergies Other        children   Heart disease Paternal Grandmother    Hodgkin's lymphoma Father     Social History   Socioeconomic History   Marital status: Married    Spouse name: Not on file   Number of children: 2   Years of education: Not on file   Highest education level: Not on file  Occupational History   Occupation: stay at home mother    Employer: UNEMPLOYED  Tobacco Use   Smoking status: Never   Smokeless tobacco: Never  Vaping Use   Vaping status: Never Used  Substance and Sexual Activity   Alcohol use: Yes    Alcohol/week: 2.0 - 3.0 standard drinks of alcohol    Types: 2 - 3 Standard drinks or equivalent per week    Comment: social   Drug use: No   Sexual activity: Not on file  Other Topics Concern   Not on file  Social History Narrative   Not on file   Social Determinants of Health   Financial Resource Strain: Not on file  Food Insecurity: Not on file  Transportation Needs: Not on file  Physical Activity: Not on file  Stress: Not on file  Social Connections: Not on file     Review of Systems  Review of Systems: A 12 point ROS discussed and pertinent positives are indicated in the HPI above.  All other systems are negative.    Physical Exam No direct physical exam was performed (except for noted visual exam findings with Video Visits).    Vital Signs: There were no vitals taken for this visit.  Imaging: IR Radiologist Eval & Mgmt  Result Date: 08/13/2023 EXAM: ESTABLISHED PATIENT OFFICE VISIT CHIEF COMPLAINT: SEE EPIC NOTE HISTORY OF PRESENT ILLNESS: SEE EPIC NOTE REVIEW OF SYSTEMS: SEE EPIC NOTE PHYSICAL EXAMINATION: SEE EPIC NOTE ASSESSMENT AND PLAN: SEE EPIC NOTE Electronically Signed   By: Malachy Moan M.D.   On: 08/13/2023 14:17   CT Angio Chest Pulmonary Embolism (PE) W or WO Contrast  Result Date: 08/06/2023 CLINICAL DATA:  Pulmonary AVM  ablation; follow -up from treatment in March 2024; pt has Hodgkin's EXAM: CT ANGIOGRAPHY CHEST WITH CONTRAST TECHNIQUE: Multidetector CT imaging of the chest was performed using the standard protocol during bolus administration of intravenous contrast. Multiplanar CT image reconstructions and MIPs were obtained to evaluate the vascular anatomy. RADIATION DOSE REDUCTION: This exam was performed according to the departmental dose-optimization program which includes automated exposure control, adjustment of the mA and/or kV according to patient size and/or use of iterative reconstruction technique. CONTRAST:  75mL ISOVUE-370 IOPAMIDOL (ISOVUE-370) INJECTION 76% COMPARISON:  CT angio chest 01/11/2023, CT angio chest movement 824 FINDINGS: Cardiovascular: No pulmonary embolus. Normal heart size. No significant pericardial effusion. The thoracic aorta is normal in caliber. No atherosclerotic plaque of the thoracic aorta. No coronary artery calcifications. Stable narrowing of the left upper lobe pulmonary artery likely post radiation changes.  Mediastinum/Nodes: Redemonstration of calcified prevascular and precardiac lymph node consistent with known treated lymphoma (9: 52-75). No enlarged mediastinal, hilar, or axillary lymph nodes. Thyroid gland, trachea, and esophagus demonstrate no significant findings. Lungs/Pleura: Biapical pleural/pulmonary scarring. 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 Similar findings at the right apex. Findings suggestive of radiation changes. No focal consolidation. Interval development of a right lower lobe solid pulmonary nodule measuring 6 mm (11:109). Interval resolution of right lower AVM status post ablation. Stable smaller region of likely AVM within the right lower lobe (11:105). Interval resolution of subpleural right lower lobe pulmonary nodule with interval development of another right lower lobe ground-glass airspace opacity measuring 7 mm  (11:106). No pulmonary mass. No pleural effusion. No pneumothorax. Upper Abdomen: No acute abnormality. Musculoskeletal: No chest wall abnormality. No suspicious lytic or blastic osseous lesions. No acute displaced fracture. Multilevel degenerative changes of the spine. Review of the MIP images confirms the above findings. IMPRESSION: 1. No pulmonary embolus. 2. Interval resolution of right lower AVM status post ablation. 3. Interval development of a right lower lobe solid pulmonary nodule measuring 6 mm. Interval resolution of subpleural right lower lobe pulmonary nodule with interval development of another right lower lobe ground-glass airspace opacity measuring 7 mm. Recommend attention on follow-up in a patient with history of Hodgkin's lymphoma. Electronically Signed   By: Tish Frederickson M.D.   On: 08/06/2023 17:22   DG Foot Complete Right  Result Date: 07/30/2023 Please see detailed radiograph report in office note.  DG Foot Complete Left  Result Date: 07/30/2023 Please see detailed radiograph report in office note.   Labs:  CBC: Recent Labs    01/07/23 1620 02/22/23 0727  WBC 5.6 6.4  HGB 14.6 13.4  HCT 43.0 39.1  PLT 365 333    COAGS: Recent Labs    02/22/23 0727  INR 0.9    BMP: Recent Labs    02/22/23 0727  NA 139  K 3.7  CL 107  CO2 23  GLUCOSE 102*  BUN 8  CALCIUM 9.3  CREATININE 0.86  GFRNONAA >60    LIVER FUNCTION TESTS: Recent Labs    01/07/23 1620  BILITOT 0.3  AST 19  ALT 11  ALKPHOS 76  PROT 7.2  ALBUMIN 4.7    TUMOR MARKERS: No results for input(s): "AFPTM", "CEA", "CA199", "CHROMGRNA" in the last 8760 hours.  Assessment and Plan:  Very pleasant 57 year old female 5 months status post embolization of right lower lobe pulmonary arteriovenous malformation.  She continues to do exceptionally well.  Her pulmonary AVM has essentially completely resolved by CT imaging.  The MVP (microvascular plug) embolization device was mistakenly  referred to as a solid pulmonary nodule.  However, there is a new small focus of groundglass attenuation nodular opacity.  This is likely insignificant but warrants further evaluation to be sure it resolves and does not persist or enlarge.  1.) CT chest WO and clinic visit in 6 months       Electronically Signed: Sterling Big 08/13/2023, 2:34 PM   I spent a total of  15 Minutes in remote  clinical consultation, greater than 50% of which was counseling/coordinating care for RLL pulmonary AVM.    Visit type: Audio only (telephone). Audio (no video) only due to patient preference. Alternative for in-person consultation at [facility], [address] [place], [place], [place]. This visit type was conducted due to national recommendations for restrictions regarding the COVID-19 Pandemic (e.g. social distancing).  This format is felt  to be most appropriate for this patient at this time.  All issues noted in this document were discussed and addressed.

## 2023-08-31 DIAGNOSIS — Z23 Encounter for immunization: Secondary | ICD-10-CM | POA: Diagnosis not present

## 2023-09-17 DIAGNOSIS — H903 Sensorineural hearing loss, bilateral: Secondary | ICD-10-CM | POA: Diagnosis not present

## 2023-09-17 DIAGNOSIS — H9042 Sensorineural hearing loss, unilateral, left ear, with unrestricted hearing on the contralateral side: Secondary | ICD-10-CM | POA: Diagnosis not present

## 2023-09-19 ENCOUNTER — Other Ambulatory Visit: Payer: Self-pay | Admitting: Otolaryngology

## 2023-09-19 DIAGNOSIS — H9042 Sensorineural hearing loss, unilateral, left ear, with unrestricted hearing on the contralateral side: Secondary | ICD-10-CM

## 2023-09-20 ENCOUNTER — Ambulatory Visit: Payer: BC Managed Care – PPO | Admitting: Podiatry

## 2023-10-02 ENCOUNTER — Other Ambulatory Visit: Payer: BC Managed Care – PPO

## 2023-10-07 ENCOUNTER — Ambulatory Visit (INDEPENDENT_AMBULATORY_CARE_PROVIDER_SITE_OTHER): Payer: BC Managed Care – PPO | Admitting: Podiatry

## 2023-10-07 DIAGNOSIS — M21619 Bunion of unspecified foot: Secondary | ICD-10-CM | POA: Diagnosis not present

## 2023-10-07 DIAGNOSIS — Z79899 Other long term (current) drug therapy: Secondary | ICD-10-CM | POA: Diagnosis not present

## 2023-10-07 DIAGNOSIS — B351 Tinea unguium: Secondary | ICD-10-CM | POA: Diagnosis not present

## 2023-10-07 MED ORDER — TERBINAFINE HCL 250 MG PO TABS: 250.0000 mg | ORAL_TABLET | Freq: Every day | ORAL | 0 refills | Status: AC

## 2023-10-07 MED ORDER — EFINACONAZOLE 10 % EX SOLN: 1.0000 [drp] | Freq: Every day | CUTANEOUS | 11 refills | Status: AC

## 2023-10-07 NOTE — Progress Notes (Unsigned)
Subjective: No chief complaint on file.  57 year old female presents as a further discuss fungus.  She said that she never did any of the treatments of the oral or topical and she wants to get started on this.  Her nails are unchanged.  No pain in her nails currently.  Also she wants to further discuss bunion surgery she is contemplating having this done as it does cause discomfort.  She tried shoe modifications, padding, offloading of significant improvement.  Objective: AAO x3, NAD DP/PT pulses palpable bilaterally, CRT less than 3 seconds The nails are about the same and they remain mildly hypertrophic with yellow, brown discoloration.  No edema, erythema, drainage or pus or any signs of infection.  No open lesions. Moderate bunions present.  Mild erythema on the area of the bunion from irritation inside shoes there is no skin breakdown or warmth.  No pain within the range of motion.  No other areas of discomfort. No pain with calf compression, swelling, warmth, erythema  Assessment: 57 year old female with onychomycosis, bunion  Plan: -All treatment options discussed with the patient including all alternatives, risks, complications.  -Will going to start Lamisil and I prescribed this for her today.  I will see repeat blood work.  Monitoring side effects. -We again discussed bunionectomy.  Discussed this procedure as well as postoperative course she is going to still contemplate having this done.  She will let me know if she wants to proceed.  The meantime continue conservative treatment. -Patient encouraged to call the office with any questions, concerns, change in symptoms.   Vivi Barrack DPM

## 2023-10-07 NOTE — Patient Instructions (Signed)
Terbinafine Tablets What is this medication? TERBINAFINE (TER bin a feen) treats fungal infections of the nails. It belongs to a group of medications called antifungals. It will not treat infections caused by bacteria or viruses. This medicine may be used for other purposes; ask your health care provider or pharmacist if you have questions. COMMON BRAND NAME(S): Lamisil, Terbinex What should I tell my care team before I take this medication? They need to know if you have any of these conditions: Liver disease An unusual or allergic reaction to terbinafine, other medications, foods, dyes, or preservatives Pregnant or trying to get pregnant Breast-feeding How should I use this medication? Take this medication by mouth with water. Take it as directed on the prescription label at the same time every day. You can take it with or without food. If it upsets your stomach, take it with food. Keep taking it unless your care team tells you to stop. A special MedGuide will be given to you by the pharmacist with each prescription and refill. Be sure to read this information carefully each time. Talk to your care team regarding the use of this medication in children. Special care may be needed. Overdosage: If you think you have taken too much of this medicine contact a poison control center or emergency room at once. NOTE: This medicine is only for you. Do not share this medicine with others. What if I miss a dose? If you miss a dose, take it as soon as you can unless it is more than 4 hours late. If it is more than 4 hours late, skip the missed dose. Take the next dose at the normal time. What may interact with this medication? Do not take this medication with any of the following: Pimozide Thioridazine This medication may also interact with the following: Beta blockers Caffeine Certain medications for mental health conditions Cimetidine Cyclosporine Medications for fungal infections like fluconazole  and ketoconazole Medications for irregular heartbeat like amiodarone, flecainide and propafenone Rifampin Warfarin This list may not describe all possible interactions. Give your health care provider a list of all the medicines, herbs, non-prescription drugs, or dietary supplements you use. Also tell them if you smoke, drink alcohol, or use illegal drugs. Some items may interact with your medicine. What should I watch for while using this medication? Visit your care team for regular checks on your progress. You may need blood work while you are taking this medication. It may be some time before you see the benefit from this medication. This medication may cause serious skin reactions. They can happen weeks to months after starting the medication. Contact your care team right away if you notice fevers or flu-like symptoms with a rash. The rash may be red or purple and then turn into blisters or peeling of the skin. Or, you might notice a red rash with swelling of the face, lips or lymph nodes in your neck or under your arms. This medication can make you more sensitive to the sun. Keep out of the sun, If you cannot avoid being in the sun, wear protective clothing and sunscreen. Do not use sun lamps or tanning beds/booths. What side effects may I notice from receiving this medication? Side effects that you should report to your care team as soon as possible: Allergic reactions--skin rash, itching, hives, swelling of the face, lips, tongue, or throat Change in sense of smell Change in taste Infection--fever, chills, cough, or sore throat Liver injury--right upper belly pain, loss of appetite, nausea,   light-colored stool, dark yellow or brown urine, yellowing skin or eyes, unusual weakness or fatigue Low red blood cell level--unusual weakness or fatigue, dizziness, headache, trouble breathing Lupus-like syndrome--joint pain, swelling, or stiffness, butterfly-shaped rash on the face, rashes that get worse  in the sun, fever, unusual weakness or fatigue Rash, fever, and swollen lymph nodes Redness, blistering, peeling, or loosening of the skin, including inside the mouth Unusual bruising or bleeding Worsening mood, feelings of depression Side effects that usually do not require medical attention (report to your care team if they continue or are bothersome): Diarrhea Gas Headache Nausea Stomach pain Upset stomach This list may not describe all possible side effects. Call your doctor for medical advice about side effects. You may report side effects to FDA at 1-800-FDA-1088. Where should I keep my medication? Keep out of the reach of children and pets. Store between 20 and 25 degrees C (68 and 77 degrees F). Protect from light. Get rid of any unused medication after the expiration date. To get rid of medications that are no longer needed or have expired: Take the medication to a medication take-back program. Check with your pharmacy or law enforcement to find a location. If you cannot return the medication, check the label or package insert to see if the medication should be thrown out in the garbage or flushed down the toilet. If you are not sure, ask your care team. If it is safe to put it in the trash, take the medication out of the container. Mix the medication with cat litter, dirt, coffee grounds, or other unwanted substance. Seal the mixture in a bag or container. Put it in the trash. NOTE: This sheet is a summary. It may not cover all possible information. If you have questions about this medicine, talk to your doctor, pharmacist, or health care provider.  2024 Elsevier/Gold Standard (2021-07-19 00:00:00)  

## 2023-10-08 DIAGNOSIS — Z79899 Other long term (current) drug therapy: Secondary | ICD-10-CM | POA: Diagnosis not present

## 2023-10-09 LAB — CBC WITH DIFFERENTIAL/PLATELET
Basophils Absolute: 0 10*3/uL (ref 0.0–0.2)
Basos: 1 %
EOS (ABSOLUTE): 0.2 10*3/uL (ref 0.0–0.4)
Eos: 2 %
Hematocrit: 44.3 % (ref 34.0–46.6)
Hemoglobin: 14.6 g/dL (ref 11.1–15.9)
Immature Grans (Abs): 0 10*3/uL (ref 0.0–0.1)
Immature Granulocytes: 0 %
Lymphocytes Absolute: 1.2 10*3/uL (ref 0.7–3.1)
Lymphs: 19 %
MCH: 30.3 pg (ref 26.6–33.0)
MCHC: 33 g/dL (ref 31.5–35.7)
MCV: 92 fL (ref 79–97)
Monocytes Absolute: 0.4 10*3/uL (ref 0.1–0.9)
Monocytes: 6 %
Neutrophils Absolute: 4.4 10*3/uL (ref 1.4–7.0)
Neutrophils: 72 %
Platelets: 355 10*3/uL (ref 150–450)
RBC: 4.82 x10E6/uL (ref 3.77–5.28)
RDW: 11.9 % (ref 11.7–15.4)
WBC: 6.2 10*3/uL (ref 3.4–10.8)

## 2023-10-09 LAB — HEPATIC FUNCTION PANEL
ALT: 11 [IU]/L (ref 0–32)
AST: 19 [IU]/L (ref 0–40)
Albumin: 4.6 g/dL (ref 3.8–4.9)
Alkaline Phosphatase: 72 [IU]/L (ref 44–121)
Bilirubin Total: 0.6 mg/dL (ref 0.0–1.2)
Bilirubin, Direct: 0.15 mg/dL (ref 0.00–0.40)
Total Protein: 7.1 g/dL (ref 6.0–8.5)

## 2023-10-10 ENCOUNTER — Other Ambulatory Visit: Payer: Self-pay | Admitting: Podiatry

## 2023-10-10 DIAGNOSIS — Z79899 Other long term (current) drug therapy: Secondary | ICD-10-CM

## 2023-10-14 DIAGNOSIS — M2012 Hallux valgus (acquired), left foot: Secondary | ICD-10-CM | POA: Diagnosis not present

## 2023-10-23 DIAGNOSIS — L718 Other rosacea: Secondary | ICD-10-CM | POA: Diagnosis not present

## 2023-10-30 ENCOUNTER — Ambulatory Visit (INDEPENDENT_AMBULATORY_CARE_PROVIDER_SITE_OTHER): Payer: BC Managed Care – PPO

## 2023-10-30 DIAGNOSIS — M21619 Bunion of unspecified foot: Secondary | ICD-10-CM | POA: Diagnosis not present

## 2023-10-30 DIAGNOSIS — M722 Plantar fascial fibromatosis: Secondary | ICD-10-CM

## 2023-10-30 NOTE — Progress Notes (Signed)
Patient was seen, measured / scanned for custom molded foot orthotics. Patient has already received a pair this year but wants to try and bill [employer] again for another pair if not covered can give the Half off discount  Patient will benefit from CFO's as they will help provide total contact to MLA's helping to better distribute body weight across BIL feet greater reducing plantar pressure and pain and to also encourage FF and RF alignment.  Patient was scanned items to be ordered and fit when in  [employer], CFo, CFm   If not covered by [employer] then can give 1/2 off discount of $271 Shannon Mann Cped, CFo, CFm

## 2023-11-05 ENCOUNTER — Other Ambulatory Visit: Payer: Self-pay | Admitting: Radiology

## 2023-11-05 NOTE — Addendum Note (Signed)
Encounter addended by: Sharnae Winfree H on: 11/05/2023 2:21 PM  Actions taken: Imaging Exam ended

## 2023-11-06 DIAGNOSIS — N951 Menopausal and female climacteric states: Secondary | ICD-10-CM | POA: Diagnosis not present

## 2023-11-06 DIAGNOSIS — M545 Low back pain, unspecified: Secondary | ICD-10-CM | POA: Diagnosis not present

## 2023-11-06 DIAGNOSIS — Z1211 Encounter for screening for malignant neoplasm of colon: Secondary | ICD-10-CM | POA: Diagnosis not present

## 2023-11-06 DIAGNOSIS — Z13220 Encounter for screening for lipoid disorders: Secondary | ICD-10-CM | POA: Diagnosis not present

## 2023-11-06 DIAGNOSIS — R7303 Prediabetes: Secondary | ICD-10-CM | POA: Diagnosis not present

## 2023-11-06 DIAGNOSIS — Z124 Encounter for screening for malignant neoplasm of cervix: Secondary | ICD-10-CM | POA: Diagnosis not present

## 2023-11-06 DIAGNOSIS — Z8571 Personal history of Hodgkin lymphoma: Secondary | ICD-10-CM | POA: Diagnosis not present

## 2023-11-06 DIAGNOSIS — Z0000 Encounter for general adult medical examination without abnormal findings: Secondary | ICD-10-CM | POA: Diagnosis not present

## 2023-11-06 DIAGNOSIS — Z23 Encounter for immunization: Secondary | ICD-10-CM | POA: Diagnosis not present

## 2023-11-06 DIAGNOSIS — Z1231 Encounter for screening mammogram for malignant neoplasm of breast: Secondary | ICD-10-CM | POA: Diagnosis not present

## 2023-11-12 ENCOUNTER — Encounter: Payer: Self-pay | Admitting: Otolaryngology

## 2023-11-18 ENCOUNTER — Ambulatory Visit
Admission: RE | Admit: 2023-11-18 | Discharge: 2023-11-18 | Disposition: A | Discharge status: Home / Self Care | Payer: BC Managed Care – PPO | Source: Ambulatory Visit | Attending: Otolaryngology | Admitting: Otolaryngology

## 2023-11-18 DIAGNOSIS — H9042 Sensorineural hearing loss, unilateral, left ear, with unrestricted hearing on the contralateral side: Secondary | ICD-10-CM | POA: Diagnosis not present

## 2023-11-18 DIAGNOSIS — H9202 Otalgia, left ear: Secondary | ICD-10-CM | POA: Diagnosis not present

## 2023-11-18 MED ORDER — GADOPICLENOL 0.5 MMOL/ML IV SOLN
6.0000 mL | Freq: Once | INTRAVENOUS | Status: AC | PRN
  Administered 2023-11-18: 6 mL via INTRAVENOUS

## 2023-11-19 ENCOUNTER — Ambulatory Visit: Payer: BC Managed Care – PPO

## 2023-11-22 NOTE — Progress Notes (Signed)
Patient presents today to pick up custom molded foot orthotics, diagnosed with PF by Dr. Ardelle Anton.   Orthotics were dispensed and fit was satisfactory. Reviewed instructions for break-in and wear. Written instructions given to patient.  Patient will follow up as needed.   Addison Bailey Cped, CFo, CFm

## 2023-11-27 DIAGNOSIS — Z1331 Encounter for screening for depression: Secondary | ICD-10-CM | POA: Diagnosis not present

## 2023-11-27 DIAGNOSIS — Z79890 Hormone replacement therapy (postmenopausal): Secondary | ICD-10-CM | POA: Diagnosis not present

## 2023-12-06 DIAGNOSIS — M8588 Other specified disorders of bone density and structure, other site: Secondary | ICD-10-CM | POA: Diagnosis not present

## 2023-12-06 DIAGNOSIS — E2839 Other primary ovarian failure: Secondary | ICD-10-CM | POA: Diagnosis not present

## 2023-12-06 DIAGNOSIS — N958 Other specified menopausal and perimenopausal disorders: Secondary | ICD-10-CM | POA: Diagnosis not present

## 2024-01-10 ENCOUNTER — Other Ambulatory Visit: Payer: BC Managed Care – PPO

## 2024-03-25 ENCOUNTER — Other Ambulatory Visit: Payer: Self-pay | Admitting: Podiatry

## 2024-04-28 DIAGNOSIS — D2262 Melanocytic nevi of left upper limb, including shoulder: Secondary | ICD-10-CM | POA: Diagnosis not present

## 2024-04-28 DIAGNOSIS — L812 Freckles: Secondary | ICD-10-CM | POA: Diagnosis not present

## 2024-04-28 DIAGNOSIS — L821 Other seborrheic keratosis: Secondary | ICD-10-CM | POA: Diagnosis not present

## 2024-04-28 DIAGNOSIS — D2261 Melanocytic nevi of right upper limb, including shoulder: Secondary | ICD-10-CM | POA: Diagnosis not present

## 2024-06-12 ENCOUNTER — Other Ambulatory Visit: Payer: Self-pay | Admitting: Interventional Radiology

## 2024-06-12 DIAGNOSIS — R911 Solitary pulmonary nodule: Secondary | ICD-10-CM

## 2024-07-03 ENCOUNTER — Encounter: Payer: Self-pay | Admitting: Advanced Practice Midwife

## 2024-07-08 DIAGNOSIS — Z1231 Encounter for screening mammogram for malignant neoplasm of breast: Secondary | ICD-10-CM | POA: Diagnosis not present

## 2024-07-08 DIAGNOSIS — Z1331 Encounter for screening for depression: Secondary | ICD-10-CM | POA: Diagnosis not present

## 2024-07-08 DIAGNOSIS — Z01419 Encounter for gynecological examination (general) (routine) without abnormal findings: Secondary | ICD-10-CM | POA: Diagnosis not present

## 2024-07-08 DIAGNOSIS — Z30432 Encounter for removal of intrauterine contraceptive device: Secondary | ICD-10-CM | POA: Diagnosis not present

## 2024-07-08 DIAGNOSIS — Z124 Encounter for screening for malignant neoplasm of cervix: Secondary | ICD-10-CM | POA: Diagnosis not present

## 2024-09-14 DIAGNOSIS — H6122 Impacted cerumen, left ear: Secondary | ICD-10-CM | POA: Diagnosis not present

## 2024-09-14 DIAGNOSIS — Z23 Encounter for immunization: Secondary | ICD-10-CM | POA: Diagnosis not present

## 2024-09-14 DIAGNOSIS — H9202 Otalgia, left ear: Secondary | ICD-10-CM | POA: Diagnosis not present

## 2024-09-28 DIAGNOSIS — Z79890 Hormone replacement therapy (postmenopausal): Secondary | ICD-10-CM | POA: Diagnosis not present

## 2024-09-28 DIAGNOSIS — M8580 Other specified disorders of bone density and structure, unspecified site: Secondary | ICD-10-CM | POA: Diagnosis not present

## 2024-11-06 DIAGNOSIS — M8589 Other specified disorders of bone density and structure, multiple sites: Secondary | ICD-10-CM | POA: Diagnosis not present

## 2024-11-06 DIAGNOSIS — Z131 Encounter for screening for diabetes mellitus: Secondary | ICD-10-CM | POA: Diagnosis not present

## 2024-11-06 DIAGNOSIS — Z8571 Personal history of Hodgkin lymphoma: Secondary | ICD-10-CM | POA: Diagnosis not present

## 2024-11-06 DIAGNOSIS — Z136 Encounter for screening for cardiovascular disorders: Secondary | ICD-10-CM | POA: Diagnosis not present

## 2024-11-06 DIAGNOSIS — R7303 Prediabetes: Secondary | ICD-10-CM | POA: Diagnosis not present

## 2024-11-06 DIAGNOSIS — Z0000 Encounter for general adult medical examination without abnormal findings: Secondary | ICD-10-CM | POA: Diagnosis not present

## 2024-11-06 DIAGNOSIS — Z13220 Encounter for screening for lipoid disorders: Secondary | ICD-10-CM | POA: Diagnosis not present

## 2024-11-06 DIAGNOSIS — B351 Tinea unguium: Secondary | ICD-10-CM | POA: Diagnosis not present

## 2024-11-11 DIAGNOSIS — L820 Inflamed seborrheic keratosis: Secondary | ICD-10-CM | POA: Diagnosis not present
